# Patient Record
Sex: Female | Born: 1940 | Race: White | Hispanic: No | Marital: Married | Smoking: Never smoker
Health system: Southern US, Community
[De-identification: ages and names within clinical notes are randomized; demographics above are authoritative.]

## PROBLEM LIST (undated history)

## (undated) DIAGNOSIS — Z87898 Personal history of other specified conditions: Secondary | ICD-10-CM

## (undated) DIAGNOSIS — E785 Hyperlipidemia, unspecified: Secondary | ICD-10-CM

## (undated) DIAGNOSIS — R55 Syncope and collapse: Secondary | ICD-10-CM

## (undated) DIAGNOSIS — D649 Anemia, unspecified: Secondary | ICD-10-CM

## (undated) DIAGNOSIS — I10 Essential (primary) hypertension: Secondary | ICD-10-CM

## (undated) DIAGNOSIS — Z90710 Acquired absence of both cervix and uterus: Secondary | ICD-10-CM

## (undated) DIAGNOSIS — I2510 Atherosclerotic heart disease of native coronary artery without angina pectoris: Secondary | ICD-10-CM

## (undated) DIAGNOSIS — E7800 Pure hypercholesterolemia, unspecified: Secondary | ICD-10-CM

## (undated) HISTORY — DX: Hyperlipidemia, unspecified: E78.5

## (undated) HISTORY — DX: Syncope and collapse: R55

## (undated) HISTORY — DX: Anemia, unspecified: D64.9

## (undated) HISTORY — PX: CORONARY ANGIOPLASTY WITH STENT PLACEMENT: SHX49

## (undated) HISTORY — PX: CHOLECYSTECTOMY: SHX55

## (undated) HISTORY — DX: Acquired absence of both cervix and uterus: Z90.710

## (undated) HISTORY — DX: Personal history of other specified conditions: Z87.898

## (undated) HISTORY — PX: APPENDECTOMY: SHX54

---

## 1999-01-26 ENCOUNTER — Other Ambulatory Visit: Admission: RE | Admit: 1999-01-26 | Discharge: 1999-01-26 | Payer: Self-pay | Admitting: Internal Medicine

## 2000-01-19 ENCOUNTER — Encounter: Admission: RE | Admit: 2000-01-19 | Discharge: 2000-01-19 | Payer: Self-pay | Admitting: Internal Medicine

## 2000-01-19 ENCOUNTER — Encounter: Payer: Self-pay | Admitting: Internal Medicine

## 2000-01-21 ENCOUNTER — Other Ambulatory Visit: Admission: RE | Admit: 2000-01-21 | Discharge: 2000-01-21 | Payer: Self-pay | Admitting: Internal Medicine

## 2001-01-08 ENCOUNTER — Other Ambulatory Visit: Admission: RE | Admit: 2001-01-08 | Discharge: 2001-01-08 | Payer: Self-pay | Admitting: Internal Medicine

## 2001-02-26 ENCOUNTER — Encounter: Payer: Self-pay | Admitting: Internal Medicine

## 2001-02-26 ENCOUNTER — Encounter: Admission: RE | Admit: 2001-02-26 | Discharge: 2001-02-26 | Payer: Self-pay | Admitting: Internal Medicine

## 2001-07-17 ENCOUNTER — Encounter: Payer: Self-pay | Admitting: Internal Medicine

## 2001-07-17 ENCOUNTER — Ambulatory Visit (HOSPITAL_COMMUNITY): Admission: RE | Admit: 2001-07-17 | Discharge: 2001-07-17 | Payer: Self-pay | Admitting: Internal Medicine

## 2002-01-31 ENCOUNTER — Other Ambulatory Visit: Admission: RE | Admit: 2002-01-31 | Discharge: 2002-01-31 | Payer: Self-pay | Admitting: Internal Medicine

## 2002-02-27 ENCOUNTER — Encounter: Payer: Self-pay | Admitting: Internal Medicine

## 2002-02-27 ENCOUNTER — Encounter: Admission: RE | Admit: 2002-02-27 | Discharge: 2002-02-27 | Payer: Self-pay | Admitting: Internal Medicine

## 2005-03-10 ENCOUNTER — Encounter: Admission: RE | Admit: 2005-03-10 | Discharge: 2005-03-10 | Payer: Self-pay | Admitting: Internal Medicine

## 2005-03-25 ENCOUNTER — Ambulatory Visit (HOSPITAL_COMMUNITY): Admission: RE | Admit: 2005-03-25 | Discharge: 2005-03-26 | Payer: Self-pay | Admitting: Cardiovascular Disease

## 2005-05-03 ENCOUNTER — Ambulatory Visit (HOSPITAL_COMMUNITY): Admission: RE | Admit: 2005-05-03 | Discharge: 2005-05-04 | Payer: Self-pay | Admitting: Cardiovascular Disease

## 2005-09-26 ENCOUNTER — Encounter (INDEPENDENT_AMBULATORY_CARE_PROVIDER_SITE_OTHER): Payer: Self-pay | Admitting: Specialist

## 2005-09-26 ENCOUNTER — Ambulatory Visit (HOSPITAL_COMMUNITY): Admission: RE | Admit: 2005-09-26 | Discharge: 2005-09-26 | Payer: Self-pay | Admitting: Obstetrics and Gynecology

## 2006-04-14 ENCOUNTER — Encounter: Admission: RE | Admit: 2006-04-14 | Discharge: 2006-04-14 | Payer: Self-pay | Admitting: Internal Medicine

## 2006-12-05 ENCOUNTER — Inpatient Hospital Stay (HOSPITAL_COMMUNITY): Admission: EM | Admit: 2006-12-05 | Discharge: 2006-12-07 | Payer: Self-pay | Admitting: Emergency Medicine

## 2006-12-06 ENCOUNTER — Encounter (INDEPENDENT_AMBULATORY_CARE_PROVIDER_SITE_OTHER): Payer: Self-pay | Admitting: *Deleted

## 2006-12-26 ENCOUNTER — Encounter: Admission: RE | Admit: 2006-12-26 | Discharge: 2006-12-26 | Payer: Self-pay | Admitting: Internal Medicine

## 2007-05-21 ENCOUNTER — Encounter: Admission: RE | Admit: 2007-05-21 | Discharge: 2007-05-21 | Payer: Self-pay | Admitting: Internal Medicine

## 2008-06-26 ENCOUNTER — Encounter: Admission: RE | Admit: 2008-06-26 | Discharge: 2008-06-26 | Payer: Self-pay | Admitting: Internal Medicine

## 2009-06-29 ENCOUNTER — Encounter: Admission: RE | Admit: 2009-06-29 | Discharge: 2009-06-29 | Payer: Self-pay | Admitting: Internal Medicine

## 2010-07-21 ENCOUNTER — Encounter: Admission: RE | Admit: 2010-07-21 | Discharge: 2010-07-21 | Payer: Self-pay | Admitting: Obstetrics and Gynecology

## 2010-07-21 IMAGING — US US ABDOMEN COMPLETE
1 series · 13 of 25 positions shown · non-contrast
Comparison: None.

CLINICAL DATA: Abdominal bloating.

COMPLETE ABDOMINAL ULTRASOUND

[Series 1: us abdomen complete · 0.24mm/px · 13 of 121 slices shown]
[im 1/121]
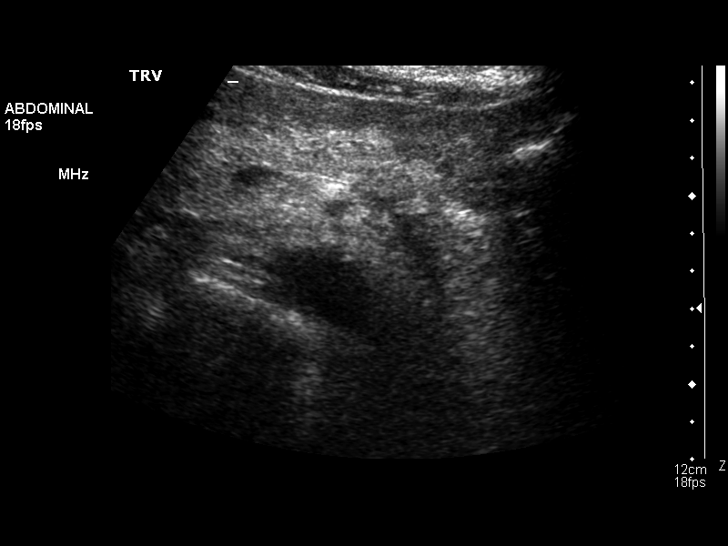
[im 11/121]
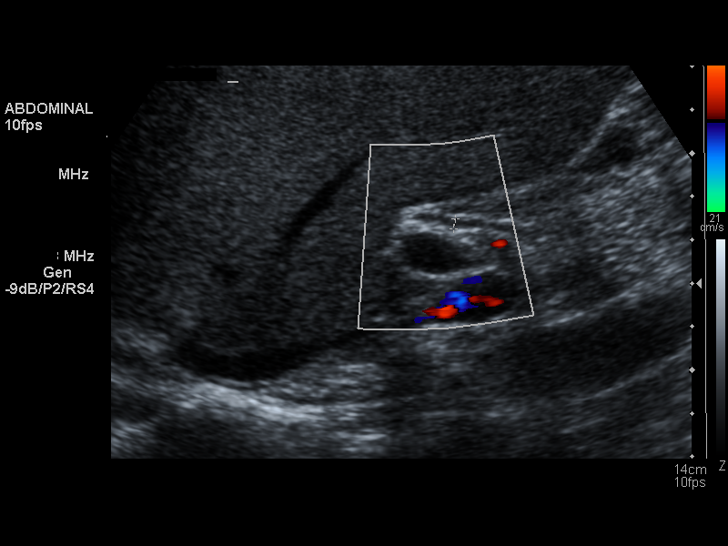
[im 21/121]
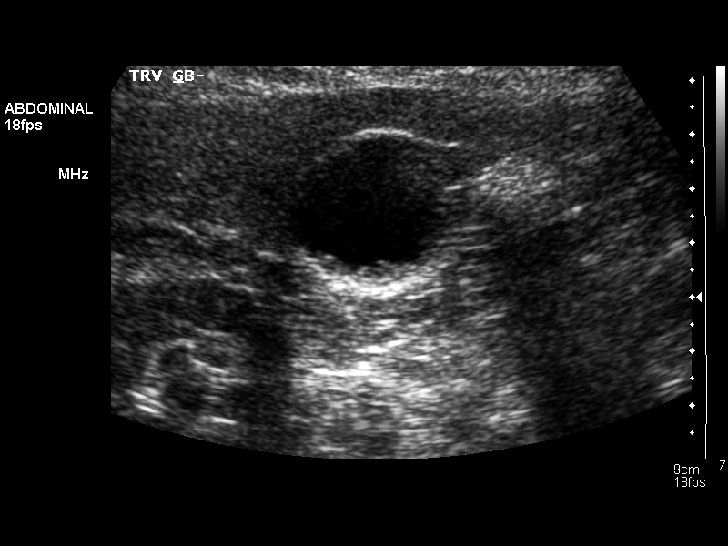
[im 31/121]
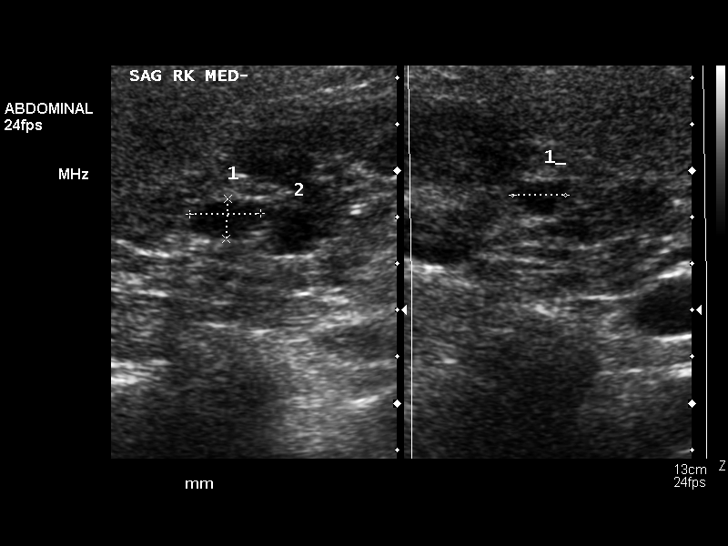
[im 41/121]
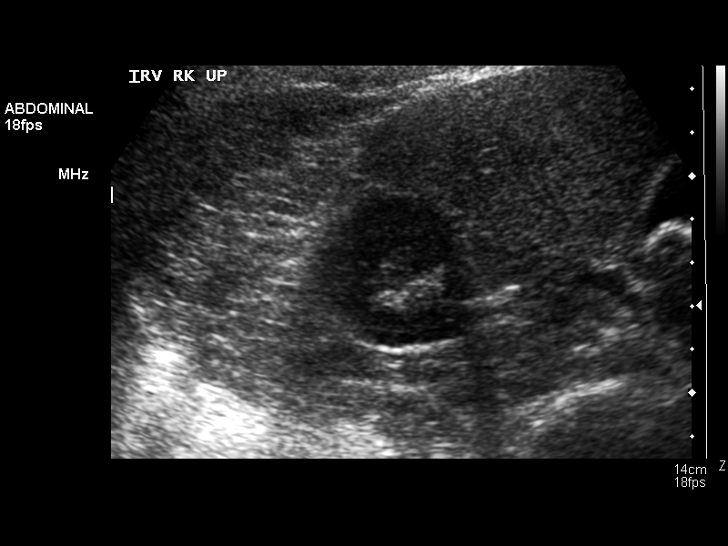
[im 51/121]
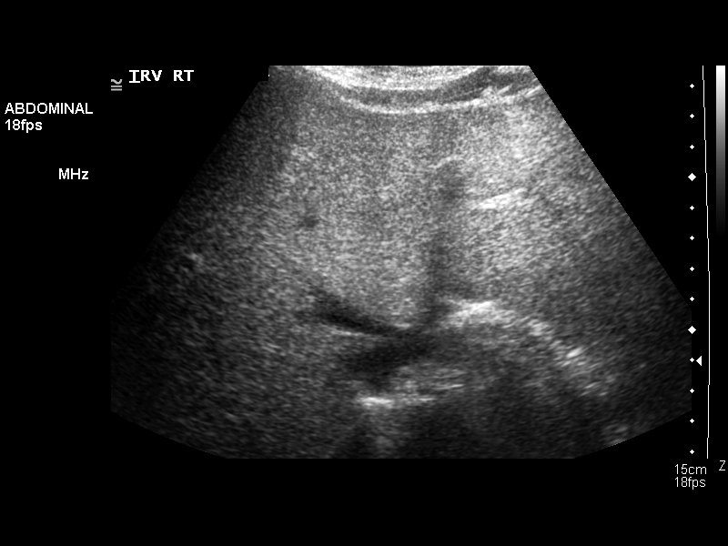
[im 61/121]
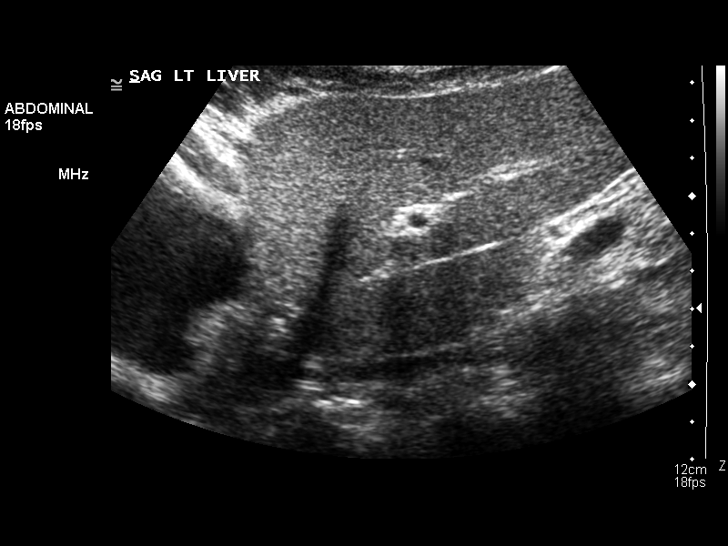
[im 71/121]
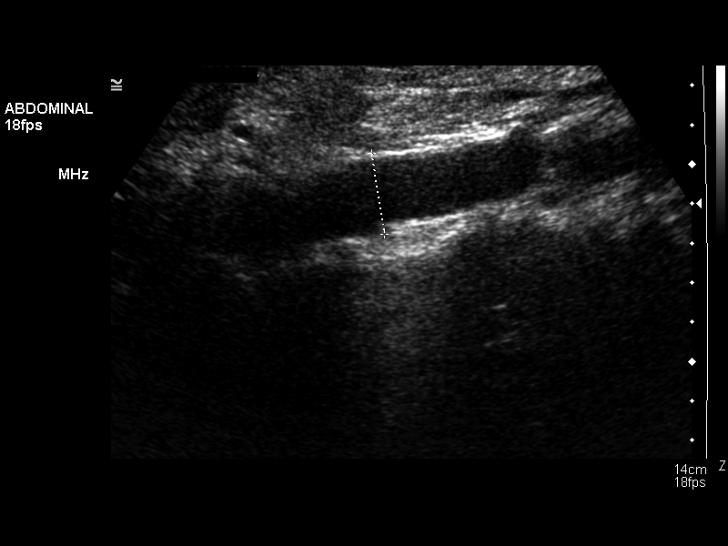
[im 81/121]
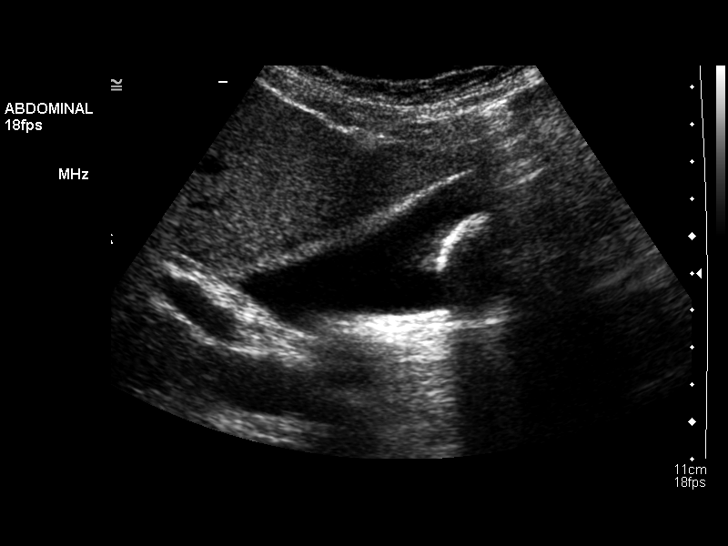
[im 91/121]
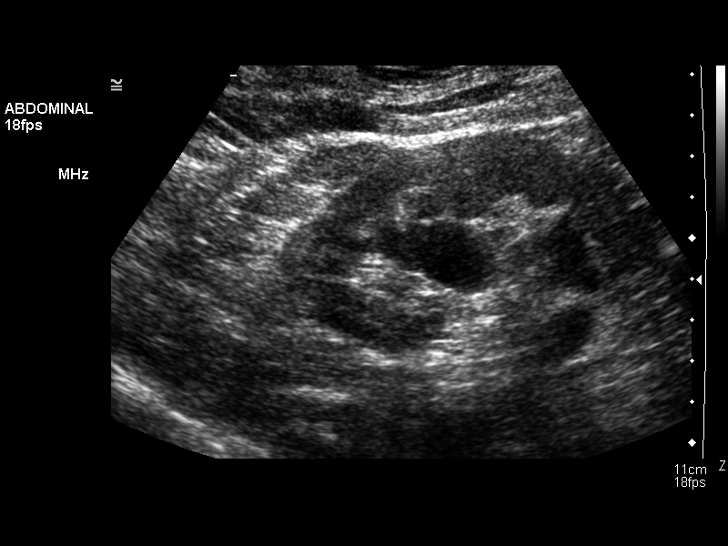
[im 101/121]
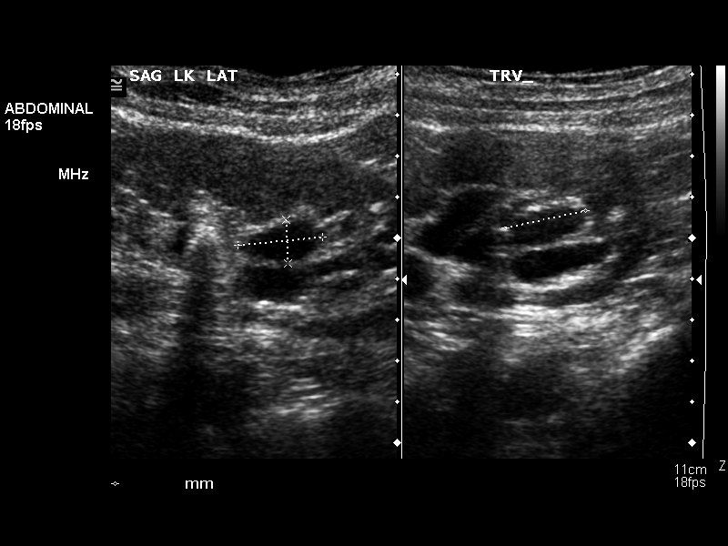
[im 111/121]
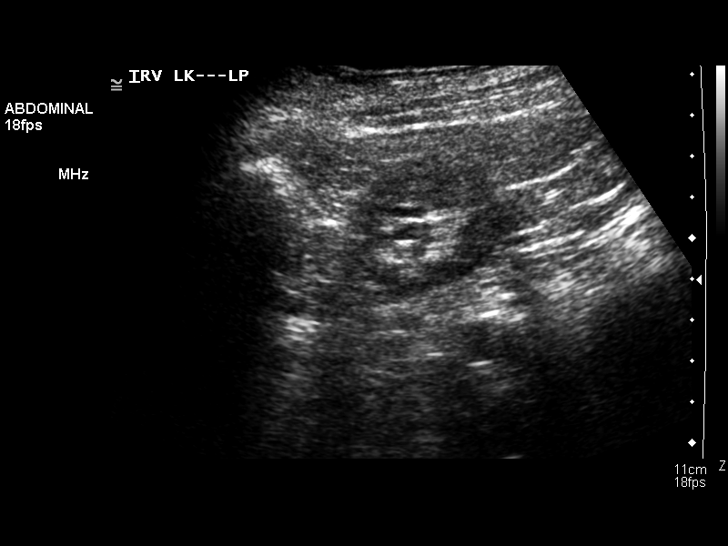
[im 121/121]
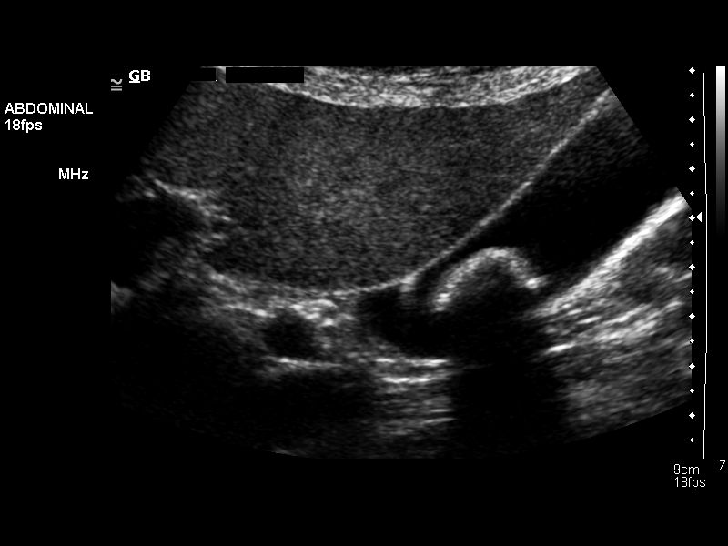

[13 of 25 positions shown; findings below may reference images not displayed]

FINDINGS: Gallbladder:  A mobile echogenic lesion with posterior acoustic
shadowing measures 2.1 cm. Question tiny polyp in the region of the
gallbladder neck.  There is some nonshadowing echogenic debris.
Gallbladder wall measures 2 mm.  No sonographic Murphy's sign.

Common bile duct:  Measures 3 mm, within normal limits.

Liver:  Echotexture is diffusely increased.

IVC:  Infrahepatic portion is obscured by bowel gas.

Pancreas:  Negative.

Spleen:  Measures 8.1 cm.

Right Kidney:  Measures 10.5 cm.  There are anechoic lesions in the
renal sinus region, measuring up to 2.0 x 1.0 x 1.8 cm.
Parenchymal echo texture is uniform.

Left Kidney:  Measures 11.5 cm.  Anechoic lesions with increased
through transmission are seen in the renal sinus, measuring up to
2.4 x 1.5 x 1.7 cm.  A linear shadowing echogenic lesion in the mid
pole measures 2.1 cm.  No hydronephrosis.

Abdominal aorta:  No aneurysm identified.
IMPRESSION: 1.  Combination of gallbladder sludge and stones, without evidence
of acute cholecystitis.
2.  Fatty liver.
3.  Probable bilateral renal sinus cysts.
4.  Nonobstructing left nephrolithiasis.

## 2010-09-03 ENCOUNTER — Encounter: Admission: RE | Admit: 2010-09-03 | Discharge: 2010-09-03 | Payer: Self-pay | Admitting: Internal Medicine

## 2011-03-25 NOTE — Discharge Summary (Signed)
NAMECARINE, [name]                ACCOUNT NO.:  [id number]   MEDICAL RECORD NO.:  [id number]          PATIENT TYPE:  OIB   LOCATION:  6532                         FACILITY:  [facility]   PHYSICIAN:  Nanetta Batty, M.D.   DATE OF BIRTH:  1941/09/25   DATE OF ADMISSION:  05/03/2005  DATE OF DISCHARGE:  05/04/2005                                 DISCHARGE SUMMARY   ADMISSION DIAGNOSES:  1.  Coronary artery disease with diagnostic catheterization status post      percutaneous coronary intervention of the left anterior descending      admitted now for percutaneous coronary intervention of the right      coronary artery.  2.  History of palpitations.  3.  Hypertension.  4.  Hyperlipidemia.   DISCHARGE DIAGNOSES:  1.  Coronary artery disease with diagnostic catheterization status post      percutaneous coronary intervention of the left anterior descending      admitted now for percutaneous coronary intervention of the right      coronary artery.  2.  History of palpitations.  3.  Hypertension.  4.  Hyperlipidemia.   PROCEDURE:  Cardiac catheterization percutaneous coronary intervention of  the right coronary artery stenting with CYPHER stent.   BRIEF HISTORY:  The patient is a 70 year old white female, who presented to  Dr. Allyson Sabal with jaw pain.  She had a negative Cardiolite and eventually  underwent cardiac catheterization which showed significant two vessel  disease including the LAD and the RCA.  She underwent PCI and stenting of  the LAD and is readmitted at this time for PCI and stenting of the RCA.   PAST MEDICAL HISTORY:  As noted above.   CURRENT MEDICATIONS:  1.  Diovan 150 mg daily.  2.  Plavix 75 mg daily.  3.  Clonidine 0.2 mg daily.  4.  Lipitor 20 mg daily.  5.  Aspirin 81 mg daily.   HOSPITAL COURSE:  The patient was admitted, taken to the cath lab and  underwent the above noted procedure, tolerated the  procedure well, returned  to the floor on 6500.  She was  stable all night.  She had one brief episode  of bigeminy.  Otherwise, she was in a sinus rhythm at a rate of 59-60 for  the majority of the night.  Blood pressure was slightly diminished this  morning right after clonidine dose, but is now back up to 107/50.  We plan  to mobilize the patient if she does well.  We will discharge home today.  She will go home on preadmission medications as listed  above with one exception, aspirin will be changed to 325 mg daily. She will  follow up with Dr. Allyson Sabal in two weeks.  She is instructed to do no strenuous  activity for 72 hours, keep the site clean and dry, and call if she has any  problems.       WDJ/MEDQ  D:  05/04/2005  T:  05/04/2005  Job:  [id number]   cc:   Merlene Laughter. Renae Gloss, M.D.  [address]  [address]  [place]  [place] [address]  Fax: [phone]

## 2011-03-25 NOTE — Op Note (Signed)
NAMESONJIA, WILCOXSON                ACCOUNT NO.:  [id number]   MEDICAL RECORD NO.:  [id number]          PATIENT TYPE:  AMB   LOCATION:  SDC                           FACILITY:  [facility]   PHYSICIAN:  Maxie Better, M.D.DATE OF BIRTH:  1941/06/03   DATE OF PROCEDURE:  09/26/2005  DATE OF DISCHARGE:                                 OPERATIVE REPORT   PREOPERATIVE DIAGNOSES:  1.  Postmenopausal bleeding.  2.  Endometrial mass.   PROCEDURE:  1.  Diagnostic hysteroscopy.  2.  Hysteroscopic resection of submucosal fibroid.  3.  Endometrial polypectomy.  4.  Dilation and evacuation.   POSTOPERATIVE DIAGNOSES:  1.  Postmenopausal bleeding.  2.  Submucosal fibroid.  3.  Endometrial polyp.   ANESTHESIA:  General.   SURGEON:  Dr. Maxie Better.   INDICATION:  This is a 70 year old gravida 3, para 2, postmenopausal female  on hormone replacement therapy with postmenopausal bleeding, who was found  to have an endometrial mass on sonohysterogram and who now presents for  surgical management.  The risks and benefits of the procedure have been  explained to the patient.  Consent was signed.  The patient was transferred  to the operating room.  Due to the fact that the patient was still on her  Plavix and just stopped her aspirin 4 days ago, she was instructed that if  there was a significant submucosal fibroid, that it would not be resected  due to concern for bleeding.  Otherwise, the plan is to hopefully just the  endometrial polyp, remove the polyp at that time, and gently do a D&C.  She  concurred with the plan.  The patient was transferred to the operating room.   DESCRIPTION OF PROCEDURE:  Under adequate general anesthesia, the patient  was placed in the dorsal lithotomy position.  She was sterilely prepped and  draped in the usual fashion.  The bladder was catheterized of a scant amount  of urine.  Examination under anesthesia revealed a relaxed introitus, small  rectocele, a  small anteverted uterus; no adnexal masses could be  appreciated.  A bivalve speculum was placed in the vagina.  A single-tooth  tenaculum was placed on the anterior lip of the cervix.  The cervix had a  pinpoint os.  Cervix was then gently dilated up to #27 [facility] dilator.  A  diagnostic hysteroscope was introduced into the uterine cavity.  Polypoid  lesion was noted in the anterior wall.  A small submucosal fibroid was seen  in the right fundal region, and a tiny polyp was noted near the right tubal  ostia.  The left tubal ostia was sclerosed; the right was patent.  No  lesions in the endocervical canal were seen.  The hysteroscope was removed.  A polyp forceps was then utilized to grasp the polyp anteriorly in 2 pieces,  and the hysteroscope was then reinserted.  The cavity was then inspected.  Decision was then made to attempt to resect the small fibroid.  The cervix  was then serially dilated up to a #31 dilator.  A hysteroscope with a double  loop was introduced into the uterine cavity.  Using coagulation, the small  fibroid was then resected.  The small polyp was removed.  The cavity was  inspected and no other lesions noted.  The hysteroscope was then removed.  The cavity was then curetted and tissue obtained.  The procedure was then  terminated by removing all instruments from the vagina.  Specimen labeled  endometrial polyp, submucosal fibroid, and endometrial curettings were sent  to pathology.  Estimated blood loss was minimal.  Fluid deficit was 200 mL.  Complication was none.  Sponge and instrument x2 is correct.  The patient  tolerated the procedure well, was transferred to the recovery room in stable  condition.      Maxie Better, M.D.  Electronically Signed     [place]/MEDQ  D:  09/26/2005  T:  09/26/2005  Job:  [id number]   cc:   Merlene Laughter. Renae Gloss, M.D.  Fax: [phone]

## 2011-03-25 NOTE — Discharge Summary (Signed)
[name], [name]                ACCOUNT NO.:  [id number]   MEDICAL RECORD NO.:  [id number]          PATIENT TYPE:  OIB   LOCATION:  6533                         FACILITY:  [facility]   PHYSICIAN:  Nanetta Batty, M.D.   DATE OF BIRTH:  09-Mar-1941   DATE OF ADMISSION:  03/25/2005  DATE OF DISCHARGE:                                 DISCHARGE SUMMARY   DISCHARGE DIAGNOSES:  1.  Coronary artery disease, status post elective intervention to the left      anterior descending this admission.  The patient still needs staged      procedures to the right coronary artery.  2.  Hypertension.  3.  Hyperlipidemia, treated.  4.  Palpitations.   HISTORY OF PRESENT ILLNESS:  This is 70 year old Caucasian female patient of  Dr. Andi Devon who initially presented to our office for evaluation of  chest pain and jaw pain and palpitations.  She was scheduled for Cardiolite  stress test which revealed no obvious ischemia, but some mild attenuation  and also elevated blood pressure was noted during exercise portion of the  test.  When she was seen in followup in the office by Dr. Allyson Sabal, she  continued to complain of some shortness of breath, exertional, as well as  jaw pain and the decision was made to schedule the patient for outpatient  cardiac catheterization at [facility] to make sure that she  does not have so-called balanced ischemia which was not picked up on the  Cardiolite stress test.  She underwent the outpatient cardiac  catheterization on Mar 21, 2005 and it showed three-vessel coronary disease  and LAD had 80% segmental stenosis.  The circumflex had 40-50% stenosis and  the RCA 75% stenosis.   The patient was seen in the office in followup and then scheduled for  elective coronary intervention by Dr. Allyson Sabal in [facility] on Mar 25, 2005.   HOSPITAL PROCEDURES:  Percutaneous coronary angiography and stenting using  CYPHER drug-eluting stent of left anterior  descending with reduction of the  lesion from 70% to 0%.  This was done on Mar 25, 2005 by Dr. Allyson Sabal.   HOSPITAL COMPLICATIONS:  None.   HOSPITAL COURSE:  The patient tolerated the procedure well and was  transferred to the unit in stable condition.  The next morning assessed by  Dr. Clarene Duke, her groin site did not reveal any bruit, but mild bruising.  She  was stable for discharge home.  Vital signs were normal.  Heart rate 70,  blood pressure 110/40.   The EKG showed normal sinus rhythm.   Hemoglobin 10.2, potassium 4.0, BUN 8, creatinine 0.8.   DISCHARGE MEDICATIONS:  1.  Aspirin 81 mg daily.  2.  Plavix 75 mg daily.  3.  Quinidine 20 mg daily.  4.  Diovan 160 mg daily.  5.  Lipitor 20 mg daily.  6.  Fem-HRT 1 mg/5 mcg one pill daily.   DISCHARGE ACTIVITIES:  No driving, no lifting greater than 5 pounds.  No  strenuous activity for 3 days post catheterization.   DISCHARGE DIET:  Low salt, low cholesterol diet.   DISCHARGE INSTRUCTIONS:  She was allowed to take a shower.  Instructed to  wash the groin site with mild soap gently and pat dry.  Avoid rubbing.   DISCHARGE FOLLOWUP:  Our office will call the patient to set up an  appointment with Dr. Allyson Sabal to be seen within 2 weeks.      MK/MEDQ  D:  03/26/2005  T:  03/27/2005  Job:  [id number]   cc:   [facility]

## 2011-03-25 NOTE — Discharge Summary (Signed)
[name], [name]                ACCOUNT NO.:  [id number]   MEDICAL RECORD NO.:  [id number]          PATIENT TYPE:  INP   LOCATION:  1401                         [facility]ACILITY:  [facility]   PHYSICIAN:  Hillery Aldo, M.D.   DATE OF BIRTH:  Aug 10, 1941   DATE OF ADMISSION:  12/05/2006  DATE OF DISCHARGE:  12/07/2006                               DISCHARGE SUMMARY   PRIMARY CARE PHYSICIAN:  Dr. Andi Devon.   CARDIOLOGIST:  Dr. Nanetta Batty.   DISCHARGE DIAGNOSES:  1. Syncope thought to be related to dehydration in the setting of      ongoing antihypertensive use  2. Normocytic anemia, outpatient workup recommended.  3. Hyperlipidemia.  4. Hypertension.  5. Coronary artery disease.  6. History of vertigo.   DISCHARGE MEDICATIONS:  1. Aspirin 325 mg daily.  2. Benicar/hydrochlorothiazide 40/25 daily.  3. Clonidine 0.2 mg every night.  4. Lipitor 20 mg daily.  5. Multivitamin daily.  6. Plavix 75 mg daily.   CONSULTATIONS:  None.   BRIEF HISTORY OF PRESENT ILLNESS:  The patient is a 70 year old female  whose daughter was recently admitted to the hospital.  The patient  evidently stayed with the daughter late, had scanty p.o. intake, took  her antihypertensive medications and then when awoken from sleep at  about 4:00 a.m., felt dizzy.  She then had several episodes of nausea  accompanied by vomiting and had a syncopal episode.  She was admitted  for further evaluation and workup.  For the full details, please see the  dictated report done by Dr. Corky Downs.   PROCEDURES AND DIAGNOSTIC STUDIES:  1. CT scan of the head on December 05, 2006 was negative.  2. Chest x-ray on December 05, 2006 showed no acute disease.  3. MRI/MRA of brain on December 05, 2006 showed normal MRI of the      brain.  Normal intracranial MR angiography of the large and medium      sized vessels.  4. A 2-D echocardiogram on December 06, 2006 showed normal left      ventricular systolic function with no left  ventricular regional      wall motion abnormalities.  There was mild mitral valvular      regurgitation.   DISCHARGE LABORATORY VALUES:  Sodium was 147, potassium 4.1, chloride  117, bicarb 25, BUN 11, creatinine 0.90, glucose 102.  White blood cell  count 6.9, hemoglobin 10.3, hematocrit 29.9, platelets 245 with an MCV  of 91.1.  Repeat urinalysis was negative for nitrites and leukocyte  esterase.   HOSPITAL COURSE:  1. Syncope:  The patient's syncope was thought to be related to      ongoing antihypertensive use in the setting of dehydration.  Her      BUN and creatinine were elevated suggesting dehydration.  This      resolved with IV fluid rehydration.  The patient had no further      dizzy spells while in the hospital.  Her antihypertensive      medications were held secondary to low blood pressure.  Her  discharge blood pressure is 135/81.  She is instructed to resume      her antihypertensives.  2. Questionable urinary tract infection:  The patient did have a      urinalysis with culture done on admission.  Culture inevitably grew      out 40,000 colonies of multiple bacterial morphotypes.  Urinalysis      was repeated which was negative for nitrites and leukocyte      esterase.  3. Normocytic anemia:  The patient does have a persistent normocytic      anemia.  Her hemoglobin has dropped with IV fluid rehydration.  We      recommend further GI evaluation as an outpatient for further      workup.  4. Hyperlipidemia:  The patient's fasting lipid panel was checked      while she was in the hospital.  Testing revealed a total      cholesterol of 128, triglycerides 144, HDL 40, and LDL 59,      indicating excellent control on her current dose of Lipitor.  5. Hypertension:  Again, the patient's antihypertensives were held      during the course of her hospitalization.  Her blood pressure has      now crept up, and it is safe for her to resume these medications.  6. Coronary  artery disease:  The patient was continued on antiplatelet      therapy and had no complaints of chest pain during the course of      her hospitalization.  Additionally, cardiac markers were cycled      q.8h. x3.  Her troponin I was not elevated.  She did have a mild      elevation of her CK and CK-MB, but the index was not significant.      CK normalized on the third set as did the MB.   DISPOSITION:  At this point, the patient is stable for discharge home.  She is instructed to follow up with Dr. Renae Gloss early next week.      Hillery Aldo, M.D.  Electronically Signed     CR/MEDQ  D:  12/07/2006  T:  12/07/2006  Job:  [id number]   cc:   Merlene Laughter. Renae Gloss, M.D.  Fax: [phone]   Nanetta Batty, M.D.  Fax: [phone]

## 2011-03-25 NOTE — Cardiovascular Report (Signed)
[name], [name]                ACCOUNT NO.:  [id number]   MEDICAL RECORD NO.:  [id number]          PATIENT TYPE:  OIB   LOCATION:  2899                         FACILITY:  [facility]   PHYSICIAN:  Nanetta Batty, M.D.   DATE OF BIRTH:  [birth year]   DATE OF PROCEDURE:  03/25/2005  DATE OF DISCHARGE:                              CARDIAC CATHETERIZATION   PROCEDURE PERFORMED:  Intervention.   CARDIOLOGIST:  Nanetta Batty, M.D.   INDICATIONS FOR PROCEDURE:  [name] is a 70 year old female referred by  Dr. Renae Gloss because of episodes of jaw pain.  The patient had a negative  Cardiolite in the past.  She underwent a diagnostic cath earlier this week  at [facility] revealing ischemic disease in the LAD and the RCA  with moderate disease in the circumflex, and normal LV function.  She  presents now for angiography and intervention with IVUS guidance.   DESCRIPTION OF PROCEDURE:  The patient was brought to the second floor [facility] cardiac cath lab in the post absorptive state.  She was premedicated  with p.o. Valium.  The right groin was prepped and shaved in the usual  sterile fashion.  One percent Xylocaine was used for local anesthesia.  A 7  French sheath was inserted into the right femoral artery using the standard  Seldinger technique.  A 6 French sheath was inserted into the right femoral  vein.  A 7 French left 3.5 __________  with side holes along with an OM-4  190 Asahi soft guidewire and a 2.5 x 10 cutting balloon were used for  atherectomy of the LAD after IVUS interrogation.   Visipaque dye was used for the entirety of the case.  Retrograde aortic  pressure was monitored at the end of the case.  The patient had been on  aspirin and Plavix, and received an additional 300 mg of Plavix p.o. She  received 3500 units of heparin intravenously and Integrilin double bolus  infusion.   IVUS interrogation of the LAD revealed the distal reference to be about 3 mm  and the vessel to be, at its thinnest point, 1.4 x 1.8.  This was then cut  with the cutting balloon, and nominal pressures, and stented with a 3.0 x 18  Cypher at 12 atmospheres.  Two-hundred micrograms of intracoronary  nitroglycerin was administered.   The final angiographic result was reduction of a 70% angiographic and  tighter by IVUS mid segmental LAD lesion to zero percent residual an TIMI  III flow.  The patient did complain of chest pain during balloon inflation,  which resolved with balloon deflation.  The diagonal branch which arose from  in the middle of this plaque remained widely patent during the procedure and  was small-to-medium in size with a 75% ostial stenosis.   The guidewire was then withdrawn and advanced down the circumflex.  IVUS was  performed to the circumflex revealing approximately a 3 mm x 3 mm distal  vessel with 2.3 x 2.5 at its tightest diameter.  The plaque extended to the  ostium of the circumflex.  IMPRESSION:  1.  Left anterior descending percutaneous coronary intervention and stenting      using a Cypher drug-eluting stent.  2.  Intravascular ultrasound of the proximal circumflex revealed extensive      plaque; however, this did not warrant intervention, especially given its      proximity to the left main.   The guidewire and the catheter were removed.  The __________.  The sheaths  were sewed securely in place and observed overnight.  Heparin was  discontinued.  Integrilin will be continued for 18 hours.  The sheaths will  be removed once she __________ .  She will be discharged home in the  morning.  I will see her back in the office in one to two weeks.  We will  stage intervention on the RCA.   The patient left the lab in stable condition.      JB/MEDQ  D:  03/25/2005  T:  03/26/2005  Job:  [id number]   cc:   Second Floor [facility]   [facility]  [address]   [place], [place]  [address]   Merlene Laughter. Renae Gloss, M.D.  [address]  [address]  [place]  [place] [address]  Fax: [phone]

## 2011-03-25 NOTE — Cardiovascular Report (Signed)
[name], [name]                ACCOUNT NO.:  [id number]   MEDICAL RECORD NO.:  [id number]          PATIENT TYPE:  OIB   LOCATION:  2899                         FACILITY:  [facility]   PHYSICIAN:  Nanetta Batty, M.D.   DATE OF BIRTH:  05-Aug-1941   DATE OF PROCEDURE:  05/03/2005  DATE OF DISCHARGE:                              CARDIAC CATHETERIZATION   PROCEDURE:  Percutaneous coronary intervention and stenting.   CARDIOLOGIST:  Nanetta Batty, M.D.   INDICATIONS FOR PROCEDURE:  [name] is a 70 year old female, a patient of  Dr. Cala Bradford R. Shelton's who was complaining of jaw pain but had not gotten  __________.  The initial cardiac catheterization performed at [facility] revealed LAD and RCA disease with mild circumflex disease and  normal LV function.  She underwent an IVUS-guided PCI and stenting of her  proximal LAD with a 30.18 CYPHER drug-eluting stent.  IVUS of her proximal  circumflex did not reveal a critical lesion.  She presents now for staged  intervention of her mid-RCA.   DESCRIPTION OF PROCEDURE:  The patient was brought to the second floor [facility]iac Catheterization Laboratory in the post-  absorptive state.  She is premedicated with p.o. Valium.  Her right groin is  prepped and shaved in the usual sterile fashion.  Xylocaine 1% was used for  local anesthesia.  A 6-French sheath was inserted into the right femoral  artery using a standard Seldinger technique.  The 6-French JR4 guide  catheter along with an OM4 190 Ashahi soft wire and a 3.0 x 13.0 CYPHER drug-  eluting stent were used for direct stenting.  The patient was on aspirin and  Plavix at home and received an additional 150 mg of Plavix this morning.  She received Angiomax bolus of an ACT greater than 300.  Visipaque dye was  used for the entirety of the case.  Retrograde aortic pressure monitored  through the case.   Using the Ashahi wire, the lesion was crossed  and direct stenting was  performed with the CYPHER stent at 15 atmospheres.  Following this, post-  dilatation was a 3.25 x 12.0 Quantum Maverick was attempted over the Ashahi  soft; however, because of wire bias, the balloon was unable to cross the  proximal stent, probably because of strut hang-up.  A buddy wire technique  was used with a BMW wire, over which the Quant rum Maverick passed smoothly  into the stented segment.  The Ashahi wire was then removed and the balloon  placement was verified precisely angiographically, fluoroscopically.  It was  inflated to 15 atmospheres for 40 seconds, resulting in a reduction of a 70%  mid-RCA stenosis to 0% residual without dissection.   The patient tolerated the procedure well.  There were no hemodynamic  complications or sequela.  The guide wire and catheter were removed.  There  was some oozing around the sheath and thus this was up-graded over the J-  wire  to a 7-French sheath, which resulted in hemostasis.  The sheath will be  removed in two hours.  The patient will be hydrated and discharged in the  morning on aspirin and Plavix.  We will see her back in the office in two  weeks for followup.  She left the laboratory in stable condition.       JB/MEDQ  D:  05/03/2005  T:  05/03/2005  Job:  [id number]   cc:   Cardiac Cath Lab  -  [facility]   [place] R. Renae Gloss, M.D.  [address]  [address]  [place]  [place] [address]  Fax: [phone]

## 2011-03-25 NOTE — H&P (Signed)
[name], [name]                ACCOUNT NO.:  [id number]   MEDICAL RECORD NO.:  [id number]          PATIENT TYPE:  EMS   LOCATION:  ED                           FACILITY:  [facility]   PHYSICIAN:  Mobolaji B. Bakare, M.D.DATE OF BIRTH:  [birth year]   DATE OF ADMISSION:  12/05/2006  DATE OF DISCHARGE:                              HISTORY & PHYSICAL   Primary care physician:  Merlene Laughter. Renae Gloss, M.D.  Cardiologist:  Nanetta Batty, M.D.   CHIEF COMPLAINT:  Dizziness and passed out early this morning.   HISTORY OF PRESENTING COMPLAINT:  [name] is a 70 year old Caucasian  female whom I saw her daughter yesterday in the emergency room.  Her  daughter was admitted and the patient stayed with her daughter until  about 11 p.m., when she went back home to use her medications.  Apparently she skipped lunch and for the most part only had [employer]  burger yesterday evening and her fluid intakes have been low.  She  returned back to the emergency room to stay with her daughter.  About 4  a.m. she was woken up from sleep.  She felt dizzy.  There was no  vertigo, no chest pain or headaches or shortness of breath.  She  developed nausea and vomiting and she vomited twice.  The next day she  noted that she passed out and was on the floor.  She bumped her  forehead.  Head CT scan was negative for any intracranial abnormality.  She had an EKG which showed marked sinus bradycardia with a heart rate  of 56.  Initial vitals showed a temperature of 96.6 and blood pressure  of 95/54.  She was not orthostatic.  The patient's blood pressure  usually runs around 120 systolic and 60-70 diastolic.   She has since received IV fluids.  She is feeling better, no more  vomiting, no more nausea.  She denies chest pain, shortness of breath,  palpitations.  There is no vertigo.   REVIEW OF SYSTEMS:  There is no dysuria, urgency or increased frequency  of micturition.  No abdominal pain or diarrhea.  The  patient denies  focal weakness.   PAST MEDICAL HISTORY:  1. Hyperlipidemia.  2. Hypertension.  3. Coronary artery disease, status post two stents in 2006.  4. History of vertigo.   PAST SURGICAL HISTORY:  1. Appendectomy.  2. Cholecystectomy.  3. Hysterectomy.   CURRENT MEDICATIONS:  1. Aspirin 325 mg daily.  2. Benicar/hydrochlorothiazide 40/25 mg daily.  3. Clonidine 0.2 mg q.h.s.  4. Lipitor 20 mg daily.  5. Multivitamin one daily.  6. Plavix 75 mg daily.   ALLERGIES:  CODEINE.   FAMILY HISTORY:  Both parents are deceased.  Father passed away from  heart troubles.  Mother died of liver cancer 10 years ago.  Her  grandmother had CVA.   SOCIAL HISTORY:  She is a Immunologist.  She does not smoke cigarettes  nor drink alcohol.  She is independent of activities of daily living.  She has 3 daughters.  One has cerebral palsy.   PHYSICAL EXAMINATION:  VITAL  SIGNS:  Temperature 96.6, now 97, blood  pressure 95/54, currently 105/53, a pulse rate which is 60-65,  respiratory rate is 16, O2 saturations are 95% on room air.  GENERAL:  On examination, the patient is comfortable, not in any  respiratory distress.  HEENT:  Normocephalic.  Slight bruise on the forehead and bridge of  nose.  There is no significant hematoma.  Pupils equal, round, and  reactive to light.  Mild nystagmus.  There is no elevated JVD.  Mucous  membranes dry.  There is no carotid bruit.  LUNGS:  Clear clinically to auscultation.  CARDIOVASCULAR:  S1-S2 regular.  No murmur or gallop.  ABDOMEN:  Nondistended, soft, nontender, bowel sounds present.  EXTREMITIES:  No pedal edema or calf tenderness.  Dorsalis pedis pulse  is 2+ bilaterally.  CENTRAL NERVOUS SYSTEM:  No focal neurologic weakness.  Cranial nerves  intact.   INITIAL LABORATORY DATA:  Sodium 142, potassium 3.5, chloride 109, CO2  24, glucose 122, BUN 23, creatinine 1.33, calcium 9.5.  White cells  12.3, hemoglobin 11.8, hematocrit 34.2, MCV  90.6, platelets 260,  neutrophils 91%.  Cardiac markers at the point of care:  Myoglobin 330,  CK, MB and troponin were normal.  Second set of cardiac markers showed  myoglobin of 305 with normal CK, MB and troponin.  Urinalysis:  Turbid  in appearance, specific gravity of 1.021, nitrite negative, small  leukocytes.  Microscopy showed many epithelial cells with white cell of  7-10.  A CT scan was negative for any intracranial abnormality.  Chest x-  ray shows no acute abnormality.   ASSESSMENT AND PLAN:  [name] is a 70 year old Caucasian female who  presents with syncope which was preceded by dizziness and vomiting with  nausea.  She was not orthostatic at the time of initial evaluation.  She  had relative hypotension and mild hypothermia.  EKG showed sinus  bradycardia with a heart rate of 56.  No acute ischemic changes.  Cardiac markers at the point of care show slightly elevated myoglobin.  She does have a history of coronary artery disease and hyperlipidemia.   ADMISSION DIAGNOSES:  1. Syncope, likely vasovagal.  Will admit to telemetry and cycle      cardiac enzymes q.8h.  Obtain 2 D echocardiogram.  Will repeat  orthostatic blood pressure and continue IV fluids at 75 mL/hr.  Zofran 4  mg q.4h. for nausea.  1. Dizziness associated with nausea and vomiting.  The patient does      have a history of vertigo.  She had mild nystagmus on examination.      These symptoms may be related to chronic vertigo.  Will check      MRI/MRA of the head and neck to rule out posterior circulation      stroke.  The patient does not have neurologic weakness at this      point.  2. Dehydration with elevated creatinine and relative hypotension.      This might have been compounded with poor p.o. intake for the most      part yesterday while she was with her daughter in the hospital.  In      addition, she used a q.h.s. clonidine prior to onset of symptoms.     Will hold antihypertensives for now and  continue hydration with IV      fluids.  3. Mild hypothermia.  This has resolved.  4. Positive urinalysis.  This suggests contamination given the too  many epithelial cells noted.  I will repeat urinalysis and obtain a      clean catch urine.  5. History of coronary artery disease.  She does not have anginal      symptoms.  We will monitor her on telemetry and cycle cardiac      enzymes as mentioned above.  6. Hyperlipidemia.  Resume Lipitor.  Will check fasting lipid profile.      Mobolaji B. Corky Downs, M.D.  Electronically Signed     MBB/MEDQ  D:  12/05/2006  T:  12/05/2006  Job:  [id number]   cc:   Nanetta Batty, M.D.  Fax: [phone]   Merlene Laughter. Renae Gloss, M.D.  Fax: [phone]

## 2011-09-01 ENCOUNTER — Other Ambulatory Visit: Payer: Self-pay | Admitting: Internal Medicine

## 2011-09-01 DIAGNOSIS — Z1231 Encounter for screening mammogram for malignant neoplasm of breast: Secondary | ICD-10-CM

## 2011-09-08 ENCOUNTER — Ambulatory Visit
Admission: RE | Admit: 2011-09-08 | Discharge: 2011-09-08 | Disposition: A | Discharge status: Home / Self Care | Payer: Medicare Other | Source: Ambulatory Visit | Attending: Internal Medicine | Admitting: Internal Medicine

## 2011-09-08 DIAGNOSIS — Z1231 Encounter for screening mammogram for malignant neoplasm of breast: Secondary | ICD-10-CM

## 2012-07-19 ENCOUNTER — Encounter (HOSPITAL_COMMUNITY): Payer: Self-pay | Admitting: *Deleted

## 2012-07-19 ENCOUNTER — Emergency Department (HOSPITAL_COMMUNITY): Payer: Medicare Other

## 2012-07-19 ENCOUNTER — Emergency Department (HOSPITAL_COMMUNITY)
Admission: EM | Admit: 2012-07-19 | Discharge: 2012-07-19 | Disposition: A | Discharge status: Home / Self Care | Payer: Medicare Other | Attending: Emergency Medicine | Admitting: Emergency Medicine

## 2012-07-19 DIAGNOSIS — I10 Essential (primary) hypertension: Secondary | ICD-10-CM | POA: Insufficient documentation

## 2012-07-19 DIAGNOSIS — I2510 Atherosclerotic heart disease of native coronary artery without angina pectoris: Secondary | ICD-10-CM | POA: Insufficient documentation

## 2012-07-19 DIAGNOSIS — N23 Unspecified renal colic: Secondary | ICD-10-CM

## 2012-07-19 DIAGNOSIS — R109 Unspecified abdominal pain: Secondary | ICD-10-CM | POA: Insufficient documentation

## 2012-07-19 DIAGNOSIS — N201 Calculus of ureter: Secondary | ICD-10-CM | POA: Insufficient documentation

## 2012-07-19 HISTORY — DX: Pure hypercholesterolemia, unspecified: E78.00

## 2012-07-19 HISTORY — DX: Essential (primary) hypertension: I10

## 2012-07-19 HISTORY — DX: Atherosclerotic heart disease of native coronary artery without angina pectoris: I25.10

## 2012-07-19 LAB — CBC WITH DIFFERENTIAL/PLATELET
Basophils Absolute: 0 10*3/uL (ref 0.0–0.1)
Eosinophils Absolute: 0.1 10*3/uL (ref 0.0–0.7)
Lymphocytes Relative: 14 % (ref 12–46)
Lymphs Abs: 1.6 10*3/uL (ref 0.7–4.0)
Monocytes Relative: 7 % (ref 3–12)
Neutro Abs: 9.1 10*3/uL — ABNORMAL HIGH (ref 1.7–7.7)
Platelets: 398 10*3/uL (ref 150–400)
WBC: 11.7 10*3/uL — ABNORMAL HIGH (ref 4.0–10.5)

## 2012-07-19 LAB — URINE MICROSCOPIC-ADD ON

## 2012-07-19 LAB — BASIC METABOLIC PANEL
CO2: 22 mEq/L (ref 19–32)
Calcium: 9.8 mg/dL (ref 8.4–10.5)
Chloride: 105 mEq/L (ref 96–112)
GFR calc Af Amer: 46 mL/min — ABNORMAL LOW (ref >90)
GFR calc non Af Amer: 40 mL/min — ABNORMAL LOW (ref >90)
Glucose, Bld: 144 mg/dL — ABNORMAL HIGH (ref 70–99)
Potassium: 4.9 mEq/L (ref 3.5–5.1)

## 2012-07-19 LAB — LIPASE, BLOOD: Lipase: 70 U/L — ABNORMAL HIGH (ref 11–59)

## 2012-07-19 LAB — URINALYSIS, ROUTINE W REFLEX MICROSCOPIC
Glucose, UA: NEGATIVE mg/dL
Hgb urine dipstick: NEGATIVE
Protein, ur: NEGATIVE mg/dL

## 2012-07-19 MED ORDER — TAMSULOSIN HCL 0.4 MG PO CAPS: ORAL_CAPSULE | ORAL | Status: DC

## 2012-07-19 MED ORDER — CIPROFLOXACIN HCL 500 MG PO TABS: 500.0000 mg | ORAL_TABLET | Freq: Two times a day (BID) | ORAL | Status: AC

## 2012-07-19 MED ORDER — CIPROFLOXACIN HCL 500 MG PO TABS
500.0000 mg | ORAL_TABLET | Freq: Once | ORAL | Status: AC
  Administered 2012-07-19: 500 mg via ORAL
  Filled 2012-07-19: qty 1

## 2012-07-19 MED ORDER — TAMSULOSIN HCL 0.4 MG PO CAPS
0.4000 mg | ORAL_CAPSULE | Freq: Once | ORAL | Status: AC
  Administered 2012-07-19: 0.4 mg via ORAL
  Filled 2012-07-19: qty 1

## 2012-07-19 MED ORDER — HYDROMORPHONE HCL 2 MG PO TABS: 2.0000 mg | ORAL_TABLET | ORAL | Status: AC | PRN

## 2012-07-19 NOTE — ED Provider Notes (Signed)
History     CSN: [id number]  Arrival date & time 07/19/12  0151   First MD Initiated Contact with Patient 07/19/12 [phone]      Chief Complaint  Patient presents with  . Abdominal Pain    (Consider location/radiation/quality/duration/timing/severity/associated sxs/prior treatment) HPI This is a 71 year old white female recently treated with Cipro for urinary tract infection. She is here with right lower quadrant abdominal pain that began about 10:30 yesterday evening while watching TV. It was severe at first but has considerably decreased subsequently. It is now primarily located in the right flank. She has no history of kidney stones. The pain is not worsened with movement or position. She's had nausea and retching but no frank vomiting. She is experiencing urinary urgency and passing small amounts. She's not having a fever.  Past Medical History  Diagnosis Date  . Coronary artery disease   . Hypertension   . Hypercholesteremia     Past Surgical History  Procedure Date  . Coronary angioplasty with stent placement     History reviewed. No pertinent family history.  History  Substance Use Topics  . Smoking status: Never Smoker   . Smokeless tobacco: Not on file  . Alcohol Use: No    OB History    Grav Para Term Preterm Abortions TAB SAB Ect Mult Living                  Review of Systems  All other systems reviewed and are negative.    Allergies  Codeine  Home Medications   Current Outpatient Rx  Name Route Sig Dispense Refill  . ASPIRIN 81 MG PO CHEW Oral Chew 81 mg by mouth daily.    Marland Kitchen VITAMIN D 1000 UNITS PO TABS Oral Take 1,000 Units by mouth daily.    Marland Kitchen CLONIDINE HCL 0.2 MG PO TABS Oral Take 0.2 mg by mouth daily.    . FENOFIBRATE 160 MG PO TABS Oral Take 160 mg by mouth daily.    . OMEGA-3 FATTY ACIDS 1000 MG PO CAPS Oral Take 1,200 mg by mouth daily.    Marland Kitchen LOSARTAN POTASSIUM 100 MG PO TABS Oral Take 100 mg by mouth daily.      BP 158/75  Pulse 60   Temp 97.5 F (36.4 C) (Oral)  Resp 18  SpO2 100%  Physical Exam General: Well-developed, well-nourished female in no acute distress; appearance consistent with age of record HENT: normocephalic, atraumatic Eyes: pupils equal round and reactive to light; extraocular muscles intact Neck: supple Heart: regular rate and rhythm Lungs: clear to auscultation bilaterally Abdomen: soft; nondistended; nontender; bowel sounds present GU: Right flank tenderness Extremities: No deformity; full range of motion; pulses normal Neurologic: Awake, alert and oriented; motor function intact in all extremities and symmetric; no facial droop Skin: Warm and dry Psychiatric: Normal mood and affect    ED Course  Procedures (including critical care time)     MDM   Nursing notes and vitals signs, including pulse oximetry, reviewed.  Summary of this visit's results, reviewed by myself:  Labs:  Results for orders placed during the hospital encounter of 07/19/12  CBC WITH DIFFERENTIAL      Component Value Range   WBC 11.7 (*) 4.0 - 10.5 K/uL   RBC 4.28  3.87 - 5.11 MIL/uL   Hemoglobin 12.9  12.0 - 15.0 g/dL   HCT 11.9  14.7 - 82.9 %   MCV 88.3  78.0 - 100.0 fL   MCH 30.1  26.0 -  34.0 pg   MCHC 34.1  30.0 - 36.0 g/dL   RDW 16.1  09.6 - 04.5 %   Platelets 398  150 - 400 K/uL   Neutrophils Relative 78 (*) 43 - 77 %   Neutro Abs 9.1 (*) 1.7 - 7.7 K/uL   Lymphocytes Relative 14  12 - 46 %   Lymphs Abs 1.6  0.7 - 4.0 K/uL   Monocytes Relative 7  3 - 12 %   Monocytes Absolute 0.8  0.1 - 1.0 K/uL   Eosinophils Relative 1  0 - 5 %   Eosinophils Absolute 0.1  0.0 - 0.7 K/uL   Basophils Relative 0  0 - 1 %   Basophils Absolute 0.0  0.0 - 0.1 K/uL  BASIC METABOLIC PANEL      Component Value Range   Sodium 139  135 - 145 mEq/L   Potassium 4.9  3.5 - 5.1 mEq/L   Chloride 105  96 - 112 mEq/L   CO2 22  19 - 32 mEq/L   Glucose, Bld 144 (*) 70 - 99 mg/dL   BUN 30 (*) 6 - 23 mg/dL   Creatinine, Ser  4.09 (*) 0.50 - 1.10 mg/dL   Calcium 9.8  8.4 - 81.1 mg/dL   GFR calc non Af Amer 40 (*) >90 mL/min   GFR calc Af Amer 46 (*) >90 mL/min  LIPASE, BLOOD      Component Value Range   Lipase 70 (*) 11 - 59 U/L  URINALYSIS, ROUTINE W REFLEX MICROSCOPIC      Component Value Range   Color, Urine YELLOW  YELLOW   APPearance CLOUDY (*) CLEAR   Specific Gravity, Urine 1.024  1.005 - 1.030   pH 5.5  5.0 - 8.0   Glucose, UA NEGATIVE  NEGATIVE mg/dL   Hgb urine dipstick NEGATIVE  NEGATIVE   Bilirubin Urine NEGATIVE  NEGATIVE   Ketones, ur NEGATIVE  NEGATIVE mg/dL   Protein, ur NEGATIVE  NEGATIVE mg/dL   Urobilinogen, UA 0.2  0.0 - 1.0 mg/dL   Nitrite NEGATIVE  NEGATIVE   Leukocytes, UA MODERATE (*) NEGATIVE  URINE MICROSCOPIC-ADD ON      Component Value Range   Squamous Epithelial / LPF FEW (*) RARE   WBC, UA 7-10  <3 WBC/hpf   Bacteria, UA FEW (*) RARE   Urine-Other LESS THAN 10 mL OF URINE SUBMITTED      Imaging Studies: Ct Abdomen Pelvis Wo Contrast  07/19/2012  *RADIOLOGY REPORT*  Clinical Data: Right flank pain, nausea, elevated lipase.  Mildly elevated white blood cell count  CT ABDOMEN AND PELVIS WITHOUT CONTRAST  Technique:  Multidetector CT imaging of the abdomen and pelvis was performed following the standard protocol without intravenous contrast.  Comparison: Ultrasound, 07/21/2010  Findings:  Renal: There is hydronephrosis and hydroureter of the right kidney. This is secondary to a partially obstructing calculus at the right vesicoureteral junction.  The calculus is on the bladder side of the vesicoureteral junction measuring 2 mm (image 87).  There is mild fluid around the right kidney.  Within the left kidney there is a group of course calcifications in the mid kidney measuring up to 2.5 cm and numbering approximately three calculi.  The smallest measure approximately 9 mm.  No hydroureter or ureterolithiasis on the left.  Peripelvic cysts are present on the left.  Mild scarring at  the lung bases.  No focal hepatic lesion.  Large 18 mm gallstone in the gallbladder.  The pancreas, spleen,  adrenal glands are normal.  The stomach, small bowel, appendix, and cecum are normal.  The colon rectosigmoid colon are normal.  Abdominal aorta normal caliber.  Uterus is normal.  The ovaries are normal.  No pelvic lymphadenopathy. Review of  bone windows demonstrates no aggressive osseous lesions.  IMPRESSION:  1.  Obstructing calculus within the distal right ureter at the vesicle junction (bladder side).  There is hydronephrosis and perinephric stranding of the right kidney. 2.   Multiple coarse calcifications in the left kidney.  No evidence of obstruction.   Original Report Authenticated By: Genevive Bi, M.D.     7:32 AM Discussed with Dr. Vernie Ammons who advises we place the patient on Cipro, Flomax and an analgesic. He anticipates rapid passage the stone given its current location.          Hanley Seamen, MD 07/19/12 [phone]

## 2012-07-19 NOTE — ED Notes (Signed)
Pt c/o RLQ pain radiating to R flank onset last evening @ 2200 while watching TV. Pt c/o nausea, denies v/d.

## 2012-07-19 NOTE — ED Notes (Signed)
Patient transported to CT 

## 2012-07-19 NOTE — ED Notes (Signed)
Pt reports rt-sided abd pain that began this evening w/ some nausea - denies vomiting or diarrhea - LNBM yesterday evening. Pt w/ guarding and grimacing on assessment, in no acute distress A&Ox4. Pt also reports urinary frequency as well.

## 2012-07-23 NOTE — ED Notes (Addendum)
Urine culture only 20,000 colonies.

## 2012-09-03 ENCOUNTER — Other Ambulatory Visit: Payer: Self-pay | Admitting: Internal Medicine

## 2012-09-03 DIAGNOSIS — Z1231 Encounter for screening mammogram for malignant neoplasm of breast: Secondary | ICD-10-CM

## 2012-09-11 ENCOUNTER — Ambulatory Visit
Admission: RE | Admit: 2012-09-11 | Discharge: 2012-09-11 | Disposition: A | Discharge status: Home / Self Care | Payer: 59 | Source: Ambulatory Visit | Attending: Internal Medicine | Admitting: Internal Medicine

## 2012-09-11 DIAGNOSIS — Z1231 Encounter for screening mammogram for malignant neoplasm of breast: Secondary | ICD-10-CM

## 2013-05-17 ENCOUNTER — Other Ambulatory Visit: Payer: Self-pay | Admitting: Internal Medicine

## 2013-05-17 DIAGNOSIS — R1031 Right lower quadrant pain: Secondary | ICD-10-CM

## 2013-05-23 ENCOUNTER — Ambulatory Visit
Admission: RE | Admit: 2013-05-23 | Discharge: 2013-05-23 | Disposition: A | Discharge status: Home / Self Care | Payer: Medicare Other | Source: Ambulatory Visit | Attending: Internal Medicine | Admitting: Internal Medicine

## 2013-05-23 DIAGNOSIS — R1031 Right lower quadrant pain: Secondary | ICD-10-CM

## 2013-06-06 ENCOUNTER — Other Ambulatory Visit: Payer: Self-pay | Admitting: Internal Medicine

## 2013-06-06 DIAGNOSIS — R1031 Right lower quadrant pain: Secondary | ICD-10-CM

## 2013-06-11 ENCOUNTER — Ambulatory Visit
Admission: RE | Admit: 2013-06-11 | Discharge: 2013-06-11 | Disposition: A | Discharge status: Home / Self Care | Payer: Medicare Other | Source: Ambulatory Visit | Attending: Internal Medicine | Admitting: Internal Medicine

## 2013-06-11 DIAGNOSIS — R1031 Right lower quadrant pain: Secondary | ICD-10-CM

## 2013-06-11 MED ORDER — IOHEXOL 300 MG/ML  SOLN
100.0000 mL | Freq: Once | INTRAMUSCULAR | Status: AC | PRN
  Administered 2013-06-11: 100 mL via INTRAVENOUS

## 2013-09-04 ENCOUNTER — Other Ambulatory Visit: Payer: Self-pay

## 2013-09-04 DIAGNOSIS — Z1231 Encounter for screening mammogram for malignant neoplasm of breast: Secondary | ICD-10-CM

## 2013-10-09 ENCOUNTER — Ambulatory Visit
Admission: RE | Admit: 2013-10-09 | Discharge: 2013-10-09 | Disposition: A | Discharge status: Home / Self Care | Payer: Medicare Other | Source: Ambulatory Visit

## 2013-10-09 DIAGNOSIS — Z1231 Encounter for screening mammogram for malignant neoplasm of breast: Secondary | ICD-10-CM

## 2014-01-10 ENCOUNTER — Ambulatory Visit: Payer: Medicare Other | Admitting: Cardiovascular Disease

## 2014-02-04 ENCOUNTER — Encounter: Payer: Self-pay | Admitting: Cardiovascular Disease

## 2014-02-04 ENCOUNTER — Ambulatory Visit (INDEPENDENT_AMBULATORY_CARE_PROVIDER_SITE_OTHER): Payer: Medicare Other | Admitting: Cardiovascular Disease

## 2014-02-04 VITALS — BP 160/82 | HR 56 | Ht 66.0 in | Wt 139.6 lb

## 2014-02-04 DIAGNOSIS — I10 Essential (primary) hypertension: Secondary | ICD-10-CM | POA: Insufficient documentation

## 2014-02-04 DIAGNOSIS — I2510 Atherosclerotic heart disease of native coronary artery without angina pectoris: Secondary | ICD-10-CM

## 2014-02-04 DIAGNOSIS — E785 Hyperlipidemia, unspecified: Secondary | ICD-10-CM

## 2014-02-04 NOTE — Progress Notes (Signed)
     02/04/2014 [name]   09/27/1941  [id number]  Primary Physician [name],Casey R., MD Primary Cardiologist: Casey GessJonathan J. Casey Egolf MD [name]   HPI:  The patient is a 73 year old, thin-appearing, married Caucasian female, mother of 3, grandmother to 3 grandchildren and great-grandmother to 1 great-grandchild whose husband Casey SorrowJerry is also a patient of mine. I saw her a year ago. She has a history of CAD status post staged PCI and stenting of her LAD and RCA with drug-eluting stents back in 2006. Her other problems include hypertension and hyperlipidemia. Dr. Renae Myers follows her lipid profile. She is otherwise asymptomatic. Dr. Renae Myers does follow her lipid profile closely. She it is a statin intolerant.    Current Outpatient Prescriptions  Medication Sig Dispense Refill  . aspirin 81 MG chewable tablet Chew 81 mg by mouth daily.      . cloNIDine (CATAPRES) 0.2 MG tablet Take 0.2 mg by mouth daily.      . fenofibrate 160 MG tablet Take 160 mg by mouth daily.      Marland Kitchen. losartan (COZAAR) 100 MG tablet Take 100 mg by mouth daily.       No current facility-administered medications for this visit.    Allergies  Allergen Reactions  . Codeine Nausea And Vomiting    History   Social History  . Marital Status: Married    Spouse Name: N/A    Number of Children: N/A  . Years of Education: N/A   Occupational History  . Not on file.   Social History Main Topics  . Smoking status: Never Smoker   . Smokeless tobacco: Not on file  . Alcohol Use: No  . Drug Use: No  . Sexual Activity:    Other Topics Concern  . Not on file   Social History Narrative  . No narrative on file     Review of Systems: General: negative for chills, fever, night sweats or weight changes.  Cardiovascular: negative for chest pain, dyspnea on exertion, edema, orthopnea, palpitations, paroxysmal nocturnal dyspnea or shortness of breath Dermatological: negative for rash Respiratory: negative  for cough or wheezing Urologic: negative for hematuria Abdominal: negative for nausea, vomiting, diarrhea, bright red blood per rectum, melena, or hematemesis Neurologic: negative for visual changes, syncope, or dizziness All other systems reviewed and are otherwise negative except as noted above.    Blood pressure 160/82, pulse 56, height 5\' 6"  (1.676 m), weight 139 lb 9.6 oz (63.322 kg).  General appearance: alert and no distress Neck: no adenopathy, no carotid bruit, no JVD, supple, symmetrical, trachea midline and thyroid not enlarged, symmetric, no tenderness/mass/nodules Lungs: clear to auscultation bilaterally Heart: regular rate and rhythm, S1, S2 normal, no murmur, click, rub or gallop Extremities: extremities normal, atraumatic, no cyanosis or edema  EKG sinus bradycardia of 56 without ST or T wave changes  ASSESSMENT AND PLAN:   Coronary artery disease Status post staged PCI and stenting of her LAD and RCA with drug-eluting stents back in 2006. Her last Myoview stress test performed 11/15/11 was nonischemic. She denies chest pain or shortness of breath.  Essential hypertension Controlled on current medications  Hyperlipidemia Statin intolerant on fenofibrate followed by her primary care physician      Casey GessJonathan J. Demetrius Mahler MD [facility] HospitalFACP,FACC,FAHA, [facility] EastFSCAI 02/04/2014 9:00 AM

## 2014-02-04 NOTE — Assessment & Plan Note (Signed)
Statin intolerant on fenofibrate followed by her primary care physician

## 2014-02-04 NOTE — Assessment & Plan Note (Signed)
Status post staged PCI and stenting of her LAD and RCA with drug-eluting stents back in 2006. Her last Myoview stress test performed 11/15/11 was nonischemic. She denies chest pain or shortness of breath.

## 2014-02-04 NOTE — Patient Instructions (Signed)
Your physician recommends that you schedule a follow-up appointment in: 1 year  

## 2014-02-04 NOTE — Assessment & Plan Note (Signed)
Controlled on current medications 

## 2014-03-11 ENCOUNTER — Other Ambulatory Visit: Payer: Self-pay | Admitting: Internal Medicine

## 2014-03-11 DIAGNOSIS — R1032 Left lower quadrant pain: Secondary | ICD-10-CM

## 2014-03-14 ENCOUNTER — Ambulatory Visit
Admission: RE | Admit: 2014-03-14 | Discharge: 2014-03-14 | Disposition: A | Discharge status: Home / Self Care | Payer: Medicare Other | Source: Ambulatory Visit | Attending: Internal Medicine | Admitting: Internal Medicine

## 2014-03-14 DIAGNOSIS — R1032 Left lower quadrant pain: Secondary | ICD-10-CM

## 2014-03-14 MED ORDER — IOHEXOL 300 MG/ML  SOLN
100.0000 mL | Freq: Once | INTRAMUSCULAR | Status: AC | PRN
  Administered 2014-03-14: 100 mL via INTRAVENOUS

## 2014-10-06 ENCOUNTER — Other Ambulatory Visit: Payer: Self-pay

## 2014-10-06 DIAGNOSIS — Z1231 Encounter for screening mammogram for malignant neoplasm of breast: Secondary | ICD-10-CM

## 2014-10-29 ENCOUNTER — Ambulatory Visit: Payer: Medicare Other

## 2014-11-18 ENCOUNTER — Ambulatory Visit: Payer: Medicare Other

## 2014-11-25 ENCOUNTER — Ambulatory Visit
Admission: RE | Admit: 2014-11-25 | Discharge: 2014-11-25 | Disposition: A | Discharge status: Home / Self Care | Payer: Medicare Other | Source: Ambulatory Visit

## 2014-11-25 ENCOUNTER — Encounter (INDEPENDENT_AMBULATORY_CARE_PROVIDER_SITE_OTHER): Payer: Self-pay

## 2014-11-25 DIAGNOSIS — Z1231 Encounter for screening mammogram for malignant neoplasm of breast: Secondary | ICD-10-CM

## 2015-01-28 ENCOUNTER — Encounter: Payer: Self-pay | Admitting: Cardiovascular Disease

## 2015-01-28 ENCOUNTER — Ambulatory Visit (INDEPENDENT_AMBULATORY_CARE_PROVIDER_SITE_OTHER): Payer: Medicare Other | Admitting: Cardiovascular Disease

## 2015-01-28 VITALS — BP 122/70 | HR 52 | Ht 66.0 in | Wt 144.2 lb

## 2015-01-28 DIAGNOSIS — E785 Hyperlipidemia, unspecified: Secondary | ICD-10-CM

## 2015-01-28 DIAGNOSIS — I2510 Atherosclerotic heart disease of native coronary artery without angina pectoris: Secondary | ICD-10-CM

## 2015-01-28 DIAGNOSIS — I10 Essential (primary) hypertension: Secondary | ICD-10-CM | POA: Diagnosis not present

## 2015-01-28 DIAGNOSIS — I2583 Coronary atherosclerosis due to lipid rich plaque: Secondary | ICD-10-CM

## 2015-01-28 NOTE — Assessment & Plan Note (Signed)
History of CAD status post PCI stenting of her LAD and RCA with drug eluting stents back in 2006. She denies chest pain or shortness of breath.

## 2015-01-28 NOTE — Patient Instructions (Signed)
Your physician wants you to follow-up in: 1 year with Dr Berry. You will receive a reminder letter in the mail two months in advance. If you don't receive a letter, please call our office to schedule the follow-up appointment.  

## 2015-01-28 NOTE — Assessment & Plan Note (Signed)
History of hyperlipidemia statin intolerance followed by her PCP. I did discuss with her options of PCSK9 monoclonal injectables.

## 2015-01-28 NOTE — Progress Notes (Signed)
     01/28/2015 [name]   07/18/1941  [id number]  Primary Physician Alva GarnetSHELTON,KIMBERLY R., MD Primary Cardiologist: Runell GessJonathan J. Claxton Levitz MD Roseanne RenoFACP,FACC,FAHA, FSCAI   HPI:   The patient is a 74 year old, thin-appearing, married Caucasian female, mother of 3, grandmother to 3 grandchildren and great-grandmother to 1 great-grandchild whose husband [name] is also a patient of mine. I saw her a year ago. She has a history of CAD status post staged PCI and stenting of her LAD and RCA with drug-eluting stents back in 2006. Her other problems include hypertension and hyperlipidemia. Dr. Renae Myers follows her lipid profile. She is statin intolerant. She is otherwise asymptomatic.    Current Outpatient Prescriptions  Medication Sig Dispense Refill  . aspirin 81 MG chewable tablet Chew 81 mg by mouth daily.    . Cholecalciferol 1000 UNITS tablet Take 1,000 Units by mouth daily.    . cloNIDine (CATAPRES) 0.2 MG tablet Take 0.2 mg by mouth daily.    . fenofibrate 160 MG tablet Take 160 mg by mouth daily.    Marland Kitchen. losartan (COZAAR) 100 MG tablet Take 100 mg by mouth daily.     No current facility-administered medications for this visit.    Allergies  Allergen Reactions  . Codeine Nausea And Vomiting    History   Social History  . Marital Status: Married    Spouse Name: N/A  . Number of Children: N/A  . Years of Education: N/A   Occupational History  . Not on file.   Social History Main Topics  . Smoking status: Never Smoker   . Smokeless tobacco: Not on file  . Alcohol Use: No  . Drug Use: No  . Sexual Activity: Not on file   Other Topics Concern  . Not on file   Social History Narrative     Review of Systems: General: negative for chills, fever, night sweats or weight changes.  Cardiovascular: negative for chest pain, dyspnea on exertion, edema, orthopnea, palpitations, paroxysmal nocturnal dyspnea or shortness of breath Dermatological: negative for rash Respiratory: negative for  cough or wheezing Urologic: negative for hematuria Abdominal: negative for nausea, vomiting, diarrhea, bright red blood per rectum, melena, or hematemesis Neurologic: negative for visual changes, syncope, or dizziness All other systems reviewed and are otherwise negative except as noted above.    Blood pressure 122/70, pulse 52, height 5\' 6"  (1.676 m), weight 144 lb 3.2 oz (65.409 kg).  General appearance: alert and no distress Neck: no adenopathy, no carotid bruit, no JVD, supple, symmetrical, trachea midline and thyroid not enlarged, symmetric, no tenderness/mass/nodules Lungs: clear to auscultation bilaterally Heart: regular rate and rhythm, S1, S2 normal, no murmur, click, rub or gallop Extremities: extremities normal, atraumatic, no cyanosis or edema  EKG sinus bradycardia 52 with a ST or T-wave changes. I personally reviewed this EKG  ASSESSMENT AND PLAN:   Hyperlipidemia History of hyperlipidemia statin intolerance followed by her PCP. I did discuss with her options of PCSK9 monoclonal injectables.   Essential hypertension History of hypertension with blood pressure measured at 122/70. She is on losartan and clonidine. Continue current meds at current dosing   Coronary artery disease History of CAD status post PCI stenting of her LAD and RCA with drug eluting stents back in 2006. She denies chest pain or shortness of breath.       Runell GessJonathan J. Naman Spychalski MD FACP,FACC,FAHA, [facility]CFSCAI 01/28/2015 9:50 AM

## 2015-01-28 NOTE — Assessment & Plan Note (Signed)
History of hypertension with blood pressure measured at 122/70. She is on losartan and clonidine. Continue current meds at current dosing

## 2015-08-12 ENCOUNTER — Ambulatory Visit: Payer: Self-pay

## 2015-10-21 ENCOUNTER — Other Ambulatory Visit: Payer: Self-pay

## 2015-10-21 DIAGNOSIS — Z1231 Encounter for screening mammogram for malignant neoplasm of breast: Secondary | ICD-10-CM

## 2015-11-27 ENCOUNTER — Ambulatory Visit
Admission: RE | Admit: 2015-11-27 | Discharge: 2015-11-27 | Disposition: A | Discharge status: Home / Self Care | Payer: Medicare Other | Source: Ambulatory Visit

## 2015-11-27 ENCOUNTER — Other Ambulatory Visit: Payer: Self-pay | Admitting: Internal Medicine

## 2015-11-27 DIAGNOSIS — R928 Other abnormal and inconclusive findings on diagnostic imaging of breast: Secondary | ICD-10-CM

## 2015-11-27 DIAGNOSIS — Z1231 Encounter for screening mammogram for malignant neoplasm of breast: Secondary | ICD-10-CM

## 2015-12-04 ENCOUNTER — Ambulatory Visit
Admission: RE | Admit: 2015-12-04 | Discharge: 2015-12-04 | Disposition: A | Discharge status: Home / Self Care | Payer: Medicare Other | Source: Ambulatory Visit | Attending: Internal Medicine | Admitting: Internal Medicine

## 2015-12-04 DIAGNOSIS — R928 Other abnormal and inconclusive findings on diagnostic imaging of breast: Secondary | ICD-10-CM

## 2016-04-20 ENCOUNTER — Ambulatory Visit (INDEPENDENT_AMBULATORY_CARE_PROVIDER_SITE_OTHER): Payer: Medicare Other | Admitting: Family Medicine

## 2016-04-20 VITALS — BP 160/82 | HR 59 | Temp 97.6°F | Resp 16 | Ht 66.0 in | Wt 137.0 lb

## 2016-04-20 DIAGNOSIS — R030 Elevated blood-pressure reading, without diagnosis of hypertension: Secondary | ICD-10-CM | POA: Diagnosis not present

## 2016-04-20 DIAGNOSIS — R0789 Other chest pain: Secondary | ICD-10-CM

## 2016-04-20 DIAGNOSIS — IMO0001 Reserved for inherently not codable concepts without codable children: Secondary | ICD-10-CM

## 2016-04-20 DIAGNOSIS — B029 Zoster without complications: Secondary | ICD-10-CM

## 2016-04-20 DIAGNOSIS — R21 Rash and other nonspecific skin eruption: Secondary | ICD-10-CM

## 2016-04-20 MED ORDER — VALACYCLOVIR HCL 1 G PO TABS: 1000.0000 mg | ORAL_TABLET | Freq: Three times a day (TID) | ORAL | Status: DC

## 2016-04-20 MED ORDER — HYDROCODONE-ACETAMINOPHEN 5-325 MG PO TABS: 1.0000 | ORAL_TABLET | Freq: Four times a day (QID) | ORAL | Status: DC | PRN

## 2016-04-20 NOTE — Progress Notes (Signed)
Subjective:  By signing my name below, I, Stann Ore, attest that this documentation has been prepared under the direction and in the presence of Meredith Staggers, MD. Electronically Signed: Stann Ore, Scribe. 04/20/2016 , 11:16 AM .  Patient was seen in Room 10 .   Patient ID: [name], female    DOB: Mar 21, 1941, 75 y.o.   MRN: [id number] Chief Complaint  Patient presents with  . Rash    on left upper arm/chest, painful,  concern about shingles   HPI [name] is a 75 y.o. female  Patient reports having a painful rash over her left upper arm and left upper chest noticed 2 days ago. She informs not feeling anything the night prior. She initially felt itching from the little bumps and some pain when she scratched the affected areas. She mentions it looked better yesterday, but still had some pain. She applied alcohol to the area without relief. She also noticed having a small affected area on the back of her neck around the same time. She has history of shingles. She denies any other rash down her arm. She denies applying cream or lotion.   Patient Active Problem List   Diagnosis Date Noted  . Coronary artery disease 02/04/2014  . Essential hypertension 02/04/2014  . Hyperlipidemia 02/04/2014   Past Medical History  Diagnosis Date  . Coronary artery disease   . Hypertension   . Hypercholesteremia   . Syncope     Thought to be related to dehydration in the setting of ongoing antihypertensive use  . History of vertigo   . Normocytic anemia   . Hyperlipidemia   . H/O: hysterectomy   . History of palpitations    Past Surgical History  Procedure Laterality Date  . Coronary angioplasty with stent placement    . Appendectomy    . Cholecystectomy     Allergies  Allergen Reactions  . Codeine Nausea And Vomiting   Prior to Admission medications   Medication Sig Start Date End Date Taking? Authorizing Provider  aspirin 81 MG chewable tablet Chew 81 mg by mouth  daily.   Yes Historical Provider, MD  Cholecalciferol 1000 UNITS tablet Take 1,000 Units by mouth daily.   Yes Historical Provider, MD  cloNIDine (CATAPRES) 0.2 MG tablet Take 0.2 mg by mouth daily.   Yes Historical Provider, MD  Coenzyme Q10 (CO Q 10) 10 MG CAPS Take by mouth.   Yes Historical Provider, MD  fenofibrate 160 MG tablet Take 160 mg by mouth daily.   Yes Historical Provider, MD  losartan (COZAAR) 100 MG tablet Take 100 mg by mouth daily.   Yes Historical Provider, MD   Social History   Social History  . Marital Status: Married    Spouse Name: N/A  . Number of Children: N/A  . Years of Education: N/A   Occupational History  . Not on file.   Social History Main Topics  . Smoking status: Never Smoker   . Smokeless tobacco: Not on file  . Alcohol Use: No  . Drug Use: No  . Sexual Activity: Not on file   Other Topics Concern  . Not on file   Social History Narrative   Review of Systems  Constitutional: Negative for fever, chills and fatigue.  Respiratory: Negative for cough, chest tightness, shortness of breath and wheezing.   Gastrointestinal: Negative for nausea, vomiting, diarrhea and constipation.  Skin: Positive for rash. Negative for wound.       Objective:  Physical Exam  Constitutional: She is oriented to person, place, and time. She appears well-developed and well-nourished. No distress.  HENT:  Head: Normocephalic and atraumatic.  Eyes: EOM are normal. Pupils are equal, round, and reactive to light.  Neck: Neck supple.  Back of the scalp, there are 3-4 small vesicles with faint erythematous base versus urticaria   Cardiovascular: Normal rate.   Pulmonary/Chest: Effort normal. No respiratory distress.  Musculoskeletal: Normal range of motion.  Neurological: She is alert and oriented to person, place, and time.  Skin: Skin is warm and dry.  Multiple patches of erythema with overlying clear vesicles over left upper outer chest and left anterior  shoulder  Psychiatric: She has a normal mood and affect. Her behavior is normal.  Nursing note and vitals reviewed.   Filed Vitals:   04/20/16 1052  BP: 160/82  Pulse: 59  Temp: 97.6 F (36.4 C)  TempSrc: Oral  Resp: 16  Height: 5\' 6"  (1.676 m)  Weight: 137 lb (62.143 kg)  SpO2: 97%      Assessment & Plan:   [name] is a 75 y.o. female Shingles - Plan: valACYclovir (VALTREX) 1000 MG tablet, HYDROcodone-acetaminophen (NORCO/VICODIN) 5-325 MG tablet Rash and nonspecific skin eruption - Plan: valACYclovir (VALTREX) 1000 MG tablet, HYDROcodone-acetaminophen (NORCO/VICODIN) 5-325 MG tablet Chest wall pain  - Suspected shingles based on onset of symptoms, but appears to cross a few dermatomes. HSV 1 also possible.  -Start Valtrex 1 g 3 times a day for 1 week, Tylenol over-the-counter, or hydrocodone if needed for increased pain. Discussed allergy to codeine, and if any nausea or vomiting with hydrocodone, can stop and consider tramadol  - Contact precautions discussed, RTC precautions.  Elevated blood pressure  - Asymptomatic. Advised to monitor outside of office and if remains elevated, follow up with PCP or cardiologist.  Meds ordered this encounter  Medications  . Coenzyme Q10 (CO Q 10) 10 MG CAPS    Sig: Take by mouth.  . valACYclovir (VALTREX) 1000 MG tablet    Sig: Take 1 tablet (1,000 mg total) by mouth 3 (three) times daily.    Dispense:  21 tablet    Refill:  0  . HYDROcodone-acetaminophen (NORCO/VICODIN) 5-325 MG tablet    Sig: Take 1 tablet by mouth every 6 (six) hours as needed for moderate pain.    Dispense:  15 tablet    Refill:  0   Patient Instructions       IF you received an x-ray today, you will receive an invoice from [facility]. Please contact [facility] at [phone] with questions or concerns regarding your invoice.   IF you received labwork today, you will receive an invoice from [employer].  Please contact [facility] at [phone] with questions or concerns regarding your invoice.   Our billing staff will not be able to assist you with questions regarding bills from these companies.  You will be contacted with the lab results as soon as they are available. The fastest way to get your results is to activate your My Chart account. Instructions are located on the last page of this paperwork. If you have not heard from [place] regarding the results in 2 weeks, please contact this office.    We recommend that you schedule a mammogram for breast cancer screening. Typically, you do not need a referral to do this. Please contact a local imaging center to schedule your mammogram.  [facility] - [phone]  *ask for the  Radiology Department [facility]) - [phone] or [phone]  [facility] - [phone] [facility] - [phone] [facility] - [phone]  *ask for the Radiology Department [facility] - [phone]  *ask for the Radiology Department [facility] - [phone]  *ask for the Mammography Department [facility] - [phone]  Your rash appears to be due to shingles, but with the location of it, it may also be another herpes virus or the cold sore virus (that can present on upper body as well). These are both treated with the same medication. Start Valtrex 1 pill 3 times per day for the next 1 week to cover for shingles. Tylenol over-the-counter is fine. If you need something stronger, did prescribe hydrocodone. Be very careful with this medication as it can cause sedation and dizziness. Return if any worsening of symptoms.  Your blood pressure was slightly elevated here in the office. Keep an eye on this outside the office, and if remaining over 140/90, discuss with your primary care provider or cardiologist.  Shingles Shingles, which is also known as herpes  zoster, is an infection that causes a painful skin rash and fluid-filled blisters. Shingles is not related to genital herpes, which is a sexually transmitted infection.   Shingles only develops in people who:  Have had chickenpox.  Have received the chickenpox vaccine. (This is rare.) CAUSES Shingles is caused by varicella-zoster virus (VZV). This is the same virus that causes chickenpox. After exposure to VZV, the virus stays in the body in an inactive (dormant) state. Shingles develops if the virus reactivates. This can happen many years after the initial exposure to VZV. It is not known what causes this virus to reactivate. RISK FACTORS People who have had chickenpox or received the chickenpox vaccine are at risk for shingles. Infection is more common in people who:  Are older than age 48.  Have a weakened defense (immune) system, such as those with HIV, AIDS, or cancer.  Are taking medicines that weaken the immune system, such as transplant medicines.  Are under great stress. SYMPTOMS Early symptoms of this condition include itching, tingling, and pain in an area on your skin. Pain may be described as burning, stabbing, or throbbing. A few days or weeks after symptoms start, a painful red rash appears, usually on one side of the body in a bandlike or beltlike pattern. The rash eventually turns into fluid-filled blisters that break open, scab over, and dry up in about 2-3 weeks. At any time during the infection, you may also develop:  A fever.  Chills.  A headache.  An upset stomach. DIAGNOSIS This condition is diagnosed with a skin exam. Sometimes, skin or fluid samples are taken from the blisters before a diagnosis is made. These samples are examined under a microscope or sent to a lab for testing. TREATMENT There is no specific cure for this condition. Your health care provider will probably prescribe medicines to help you manage pain, recover more quickly, and avoid long-term  problems. Medicines may include:  Antiviral drugs.  Anti-inflammatory drugs.  Pain medicines. If the area involved is on your face, you may be referred to a specialist, such as an eye doctor (ophthalmologist) or an ear, nose, and throat (ENT) doctor to help you avoid eye problems, chronic pain, or disability. HOME CARE INSTRUCTIONS Medicines  Take medicines only as directed by your health care provider.  Apply an anti-itch or numbing  cream to the affected area as directed by your health care provider. Blister and Rash Care  Take a cool bath or apply cool compresses to the area of the rash or blisters as directed by your health care provider. This may help with pain and itching.  Keep your rash covered with a loose bandage (dressing). Wear loose-fitting clothing to help ease the pain of material rubbing against the rash.  Keep your rash and blisters clean with mild soap and cool water or as directed by your health care provider.  Check your rash every day for signs of infection. These include redness, swelling, and pain that lasts or increases.  Do not pick your blisters.  Do not scratch your rash. General Instructions  Rest as directed by your health care provider.  Keep all follow-up visits as directed by your health care provider. This is important.  Until your blisters scab over, your infection can cause chickenpox in people who have never had it or been vaccinated against it. To prevent this from happening, avoid contact with other people, especially:  Babies.  Pregnant women.  Children who have eczema.  Elderly people who have transplants.  People who have chronic illnesses, such as leukemia or AIDS. SEEK MEDICAL CARE IF:  Your pain is not relieved with prescribed medicines.  Your pain does not get better after the rash heals.  Your rash looks infected. Signs of infection include redness, swelling, and pain that lasts or increases. SEEK IMMEDIATE MEDICAL CARE  IF:  The rash is on your face or nose.  You have facial pain, pain around your eye area, or loss of feeling on one side of your face.  You have ear pain or you have ringing in your ear.  You have loss of taste.  Your condition gets worse.   This information is not intended to replace advice given to you by your health care provider. Make sure you discuss any questions you have with your health care provider.   Document Released: 10/24/2005 Document Revised: 11/14/2014 Document Reviewed: 09/04/2014 Elsevier Interactive Patient Education [employer]2016 Elsevier Inc.       I personally performed the services described in this documentation, which was scribed in my presence. The recorded information has been reviewed and considered, and addended by me as needed.   Signed,   Meredith StaggersJeffrey Amarion Portell, MD [facility].  04/20/2016 11:29 AM

## 2016-04-20 NOTE — Patient Instructions (Addendum)
IF you received an x-ray today, you will receive an invoice from [facility]. Please contact [facility] at [phone] with questions or concerns regarding your invoice.   IF you received labwork today, you will receive an invoice from [facility]. Please contact [facility] at [phone] with questions or concerns regarding your invoice.   Our billing staff will not be able to assist you with questions regarding bills from these companies.  You will be contacted with the lab results as soon as they are available. The fastest way to get your results is to activate your My Chart account. Instructions are located on the last page of this paperwork. If you have not heard from us regarding the results in 2 weeks, please contact this office.    We recommend that you schedule a mammogram for breast cancer screening. Typically, you do not need a referral to do this. Please contact a local imaging center to schedule your mammogram.  [facility] - [phone](336) [phone]  *ask for the Radiology Department [facility]) - [phone]) [phone] or [phone]  [facility] - [phone] [facility] - [phone] [facility] - [phone]  *ask for the Radiology Department [facility] - [phone](336) [phone]  *ask for the Radiology Department [facility] - [phone]  *ask for the Mammography Department [facility] - [phone]  Your rash appears to be due to shingles, but with the location of it, it may also be another herpes virus or the cold sore virus (that can present on upper body as well). These are both treated with the same medication. Start Valtrex 1 pill 3 times per day for the next 1 week to cover for shingles. Tylenol over-the-counter is fine. If you need something stronger, did prescribe hydrocodone. Be very careful with this medication as it can cause  sedation and dizziness. Return if any worsening of symptoms.  Your blood pressure was slightly elevated here in the office. Keep an eye on this outside the office, and if remaining over 140/90, discuss with your primary care provider or cardiologist.  Shingles Shingles, which is also known as herpes zoster, is an infection that causes a painful skin rash and fluid-filled blisters. Shingles is not related to genital herpes, which is a sexually transmitted infection.   Shingles only develops in people who:  Have had chickenpox.  Have received the chickenpox vaccine. (This is rare.) CAUSES Shingles is caused by varicella-zoster virus (VZV). This is the same virus that causes chickenpox. After exposure to VZV, the virus stays in the body in an inactive (dormant) state. Shingles develops if the virus reactivates. This can happen many years after the initial exposure to VZV. It is not known what causes this virus to reactivate. RISK FACTORS People who have had chickenpox or received the chickenpox vaccine are at risk for shingles. Infection is more common in people who:  Are older than age 75.  Have a weakened defense (immune) system, such as those with HIV, AIDS, or cancer.  Are taking medicines that weaken the immune system, such as transplant medicines.  Are under great stress. SYMPTOMS Early symptoms of this condition include itching, tingling, and pain in an area on your skin. Pain may be described as burning, stabbing, or throbbing. A few days or weeks after symptoms start, a painful red rash appears, usually on one side of the body in a bandlike or beltlike pattern. The rash  eventually turns into fluid-filled blisters that break open, scab over, and dry up in about 2-3 weeks. At any time during the infection, you may also develop:  A fever.  Chills.  A headache.  An upset stomach. DIAGNOSIS This condition is diagnosed with a skin exam. Sometimes, skin or fluid samples are taken  from the blisters before a diagnosis is made. These samples are examined under a microscope or sent to a lab for testing. TREATMENT There is no specific cure for this condition. Your health care provider will probably prescribe medicines to help you manage pain, recover more quickly, and avoid long-term problems. Medicines may include:  Antiviral drugs.  Anti-inflammatory drugs.  Pain medicines. If the area involved is on your face, you may be referred to a specialist, such as an eye doctor (ophthalmologist) or an ear, nose, and throat (ENT) doctor to help you avoid eye problems, chronic pain, or disability. HOME CARE INSTRUCTIONS Medicines  Take medicines only as directed by your health care provider.  Apply an anti-itch or numbing cream to the affected area as directed by your health care provider. Blister and Rash Care  Take a cool bath or apply cool compresses to the area of the rash or blisters as directed by your health care provider. This may help with pain and itching.  Keep your rash covered with a loose bandage (dressing). Wear loose-fitting clothing to help ease the pain of material rubbing against the rash.  Keep your rash and blisters clean with mild soap and cool water or as directed by your health care provider.  Check your rash every day for signs of infection. These include redness, swelling, and pain that lasts or increases.  Do not pick your blisters.  Do not scratch your rash. General Instructions  Rest as directed by your health care provider.  Keep all follow-up visits as directed by your health care provider. This is important.  Until your blisters scab over, your infection can cause chickenpox in people who have never had it or been vaccinated against it. To prevent this from happening, avoid contact with other people, especially:  Babies.  Pregnant women.  Children who have eczema.  Elderly people who have transplants.  People who have chronic  illnesses, such as leukemia or AIDS. SEEK MEDICAL CARE IF:  Your pain is not relieved with prescribed medicines.  Your pain does not get better after the rash heals.  Your rash looks infected. Signs of infection include redness, swelling, and pain that lasts or increases. SEEK IMMEDIATE MEDICAL CARE IF:  The rash is on your face or nose.  You have facial pain, pain around your eye area, or loss of feeling on one side of your face.  You have ear pain or you have ringing in your ear.  You have loss of taste.  Your condition gets worse.   This information is not intended to replace advice given to you by your health care provider. Make sure you discuss any questions you have with your health care provider.   Document Released: 10/24/2005 Document Revised: 11/14/2014 Document Reviewed: 09/04/2014 Elsevier Interactive Patient Education [employer]

## 2016-07-23 ENCOUNTER — Emergency Department (HOSPITAL_COMMUNITY)
Admission: EM | Admit: 2016-07-23 | Discharge: 2016-07-23 | Disposition: A | Discharge status: Home / Self Care | Payer: Medicare Other | Attending: Emergency Medicine | Admitting: Emergency Medicine

## 2016-07-23 ENCOUNTER — Encounter (HOSPITAL_COMMUNITY): Payer: Self-pay | Admitting: Emergency Medicine

## 2016-07-23 DIAGNOSIS — I2510 Atherosclerotic heart disease of native coronary artery without angina pectoris: Secondary | ICD-10-CM | POA: Diagnosis not present

## 2016-07-23 DIAGNOSIS — Z7982 Long term (current) use of aspirin: Secondary | ICD-10-CM | POA: Insufficient documentation

## 2016-07-23 DIAGNOSIS — Z79899 Other long term (current) drug therapy: Secondary | ICD-10-CM | POA: Diagnosis not present

## 2016-07-23 DIAGNOSIS — Z955 Presence of coronary angioplasty implant and graft: Secondary | ICD-10-CM | POA: Insufficient documentation

## 2016-07-23 DIAGNOSIS — R55 Syncope and collapse: Secondary | ICD-10-CM | POA: Insufficient documentation

## 2016-07-23 DIAGNOSIS — I10 Essential (primary) hypertension: Secondary | ICD-10-CM | POA: Insufficient documentation

## 2016-07-23 LAB — CBC
HEMATOCRIT: 32.8 % — AB (ref 36.0–46.0)
Hemoglobin: 10.5 g/dL — ABNORMAL LOW (ref 12.0–15.0)
MCH: 29.5 pg (ref 26.0–34.0)
MCHC: 32 g/dL (ref 30.0–36.0)
MCV: 92.1 fL (ref 78.0–100.0)
Platelets: 167 10*3/uL (ref 150–400)
RBC: 3.56 MIL/uL — ABNORMAL LOW (ref 3.87–5.11)
RDW: 13.9 % (ref 11.5–15.5)
WBC: 6.1 10*3/uL (ref 4.0–10.5)

## 2016-07-23 LAB — BASIC METABOLIC PANEL
Anion gap: 4 — ABNORMAL LOW (ref 5–15)
BUN: 23 mg/dL — AB (ref 6–20)
CHLORIDE: 114 mmol/L — AB (ref 101–111)
CO2: 23 mmol/L (ref 22–32)
Calcium: 9 mg/dL (ref 8.9–10.3)
Creatinine, Ser: 0.89 mg/dL (ref 0.44–1.00)
GFR calc Af Amer: >60 mL/min (ref >60)
GFR calc non Af Amer: >60 mL/min (ref >60)
GLUCOSE: 147 mg/dL — AB (ref 65–99)
POTASSIUM: 3.4 mmol/L — AB (ref 3.5–5.1)
Sodium: 141 mmol/L (ref 135–145)

## 2016-07-23 LAB — CBG MONITORING, ED: Glucose-Capillary: 171 mg/dL — ABNORMAL HIGH (ref 65–99)

## 2016-07-23 MED ORDER — SODIUM CHLORIDE 0.9 % IV BOLUS (SEPSIS)
1000.0000 mL | Freq: Once | INTRAVENOUS | Status: AC
  Administered 2016-07-23: 1000 mL via INTRAVENOUS

## 2016-07-23 NOTE — ED Notes (Signed)
Declined W/C at D/C and was escorted to lobby by RN. 

## 2016-07-23 NOTE — ED Provider Notes (Signed)
[facility]EM[facility]ENCY DEPT Provider Note   CSN: [id number] Arrival date & time: 07/23/16  1251     History   Chief Complaint Chief Complaint  Patient presents with  . Loss of Consciousness    Casey UMA Myers is a 75 y.o. female.  Casey  75 year old female presenting with syncope. At around 10:30 AM the patient finished giving blood. This is the first time she's given blood before. She felt fine. She was at a cookout and while she was standing she felt lightheaded and nauseated. She sat down and felt better. She stood up again and then passed out for up to 30 seconds. Did not fall or hit her head. Never had chest pain or short of breath before or after. No headaches. Does not feel dizzy currently arrest. She states she ate a hotdog about 20 minutes before feeling this way. No abdominal pain. The nausea is better and currently mild. Has not been feeling sick prior to giving blood this morning. No palpitations  Past Medical History:  Diagnosis Date  . Coronary artery disease   . H/O: hysterectomy   . History of palpitations   . History of vertigo   . Hypercholesteremia   . Hyperlipidemia   . Hypertension   . Normocytic anemia   . Syncope    Thought to be related to dehydration in the setting of ongoing antihypertensive use    Patient Active Problem List   Diagnosis Date Noted  . Coronary artery disease 02/04/2014  . Essential hypertension 02/04/2014  . Hyperlipidemia 02/04/2014    Past Surgical History:  Procedure Laterality Date  . APPENDECTOMY    . CHOLECYSTECTOMY    . CORONARY ANGIOPLASTY WITH STENT PLACEMENT      OB History    No data available       Home Medications    Prior to Admission medications   Medication Sig Start Date End Date Taking? Authorizing Provider  aspirin 81 MG chewable tablet Chew 81 mg by mouth daily.    Historical Provider, MD  Cholecalciferol 1000 UNITS tablet Take 1,000 Units by mouth daily.    Historical Provider, MD  cloNIDine  (CATAPRES) 0.2 MG tablet Take 0.2 mg by mouth daily.    Historical Provider, MD  Coenzyme Q10 (CO Q 10) 10 MG CAPS Take by mouth.    Historical Provider, MD  fenofibrate 160 MG tablet Take 160 mg by mouth daily.    Historical Provider, MD  HYDROcodone-acetaminophen (NORCO/VICODIN) 5-325 MG tablet Take 1 tablet by mouth every 6 (six) hours as needed for moderate pain. 04/20/16   Shade Flood, MD  losartan (COZAAR) 100 MG tablet Take 100 mg by mouth daily.    Historical Provider, MD  valACYclovir (VALTREX) 1000 MG tablet Take 1 tablet (1,000 mg total) by mouth 3 (three) times daily. 04/20/16   Shade Flood, MD    Family History Family History  Problem Relation Age of Onset  . Liver cancer Mother     Social History Social History  Substance Use Topics  . Smoking status: Never Smoker  . Smokeless tobacco: Never Used  . Alcohol use No     Allergies   Codeine   Review of Systems Review of Systems  Constitutional: Negative for fever.  Respiratory: Negative for shortness of breath.   Cardiovascular: Negative for chest pain and palpitations.  Gastrointestinal: Positive for nausea. Negative for vomiting.  Neurological: Positive for syncope and light-headedness.  All other systems reviewed and are negative.  Physical Exam Updated Vital Signs BP 137/66   Pulse 77   Temp 98 F (36.7 C) (Oral)   Resp 25   Ht 5\' 6"  (1.676 m)   Wt 134 lb (60.8 kg)   SpO2 99%   BMI 21.63 kg/m   Physical Exam  Constitutional: She is oriented to person, place, and time. She appears well-developed and well-nourished.  HENT:  Head: Normocephalic and atraumatic.  Right Ear: External ear normal.  Left Ear: External ear normal.  Nose: Nose normal.  Mouth/Throat: Oropharynx is clear and moist.  Eyes: EOM are normal. Pupils are equal, round, and reactive to light. Right eye exhibits no discharge. Left eye exhibits no discharge.  Neck: Normal range of motion. Neck supple.  No meningismus    Cardiovascular: Normal rate, regular rhythm and normal heart sounds.   No murmur heard. Pulmonary/Chest: Effort normal and breath sounds normal.  Abdominal: Soft. There is no tenderness.  Neurological: She is alert and oriented to person, place, and time.  CN 3-12 grossly intact. 5/5 strength in all 4 extremities. Grossly normal sensation. Normal finger to nose.   Skin: Skin is warm and dry.  Nursing note and vitals reviewed.    ED Treatments / Results  Labs (all labs ordered are listed, but only abnormal results are displayed) Labs Reviewed  BASIC METABOLIC PANEL - Abnormal; Notable for the following:       Result Value   Potassium 3.4 (*)    Chloride 114 (*)    Glucose, Bld 147 (*)    BUN 23 (*)    Anion gap 4 (*)    All other components within normal limits  CBC - Abnormal; Notable for the following:    RBC 3.56 (*)    Hemoglobin 10.5 (*)    HCT 32.8 (*)    All other components within normal limits  CBG MONITORING, ED - Abnormal; Notable for the following:    Glucose-Capillary 171 (*)    All other components within normal limits    EKG  EKG Interpretation  Date/Time:  Saturday July 23 2016 12:51:39 EDT Ventricular Rate:  78 PR Interval:    QRS Duration: 92 QT Interval:  422 QTC Calculation: 481 R Axis:   76 Text Interpretation:  Sinus rhythm Ventricular premature complex Borderline T wave abnormalities T waves appear flatter compared to 2008 Confirmed by Iktan Aikman MD, Makani Seckman [phone]) on 07/23/2016 1:10:55 PM       Radiology No results found.  Procedures Procedures (including critical care time)  Medications Ordered in ED Medications  sodium chloride 0.9 % bolus 1,000 mL (0 mLs Intravenous Stopped 07/23/16 1552)     Initial Impression / Assessment and Plan / ED Course  I have reviewed the triage vital signs and the nursing notes.  Pertinent labs & imaging results that were available during my care of the patient were reviewed by me and considered in  my medical decision making (see chart for details).  Clinical Course  Comment By Time  Will give bolus and monitor. Likely related to the blood donation and/or hot dog. Re-eval after fluids, labs. Neuro exam benign. No concerning findings on history/exam. Pricilla Loveless, MD 09/16 1347    Feels better after fluids. Labs unremarkable besides mild dehydration which was expected. I highly doubt neuro cause or cardiac cause. Able to get up and walk, no dizziness. D/c with increased oral fluids and f/u with PCP  Final Clinical Impressions(s) / ED Diagnoses   Final diagnoses:  Syncope, unspecified syncope  type    New Prescriptions Discharge Medication List as of 07/23/2016  4:04 PM       Pricilla LovelessScott Nicasio Barlowe, MD 07/23/16 1649

## 2016-07-23 NOTE — ED Triage Notes (Signed)
Pt in from blood drive via [facility] after syncopal episode. Pt gave blood this morning at 1030, felt fine initially, ate and walked. Around 1200, pt felt nauseous, dizzy and weak. Pt was sitting in chair, passed out and remembers waking up in the grass. CBG 178 on EMS arrival, BP 122/68 for EMS. Pt arrives alert, a&ox4, c/o nausea and dizziness.

## 2016-07-23 NOTE — ED Triage Notes (Signed)
PT ambulated in hall with out assist. Pt tol. Well.

## 2016-08-24 ENCOUNTER — Other Ambulatory Visit: Payer: Self-pay | Admitting: Internal Medicine

## 2016-08-24 DIAGNOSIS — E2839 Other primary ovarian failure: Secondary | ICD-10-CM

## 2016-09-05 ENCOUNTER — Ambulatory Visit
Admission: RE | Admit: 2016-09-05 | Discharge: 2016-09-05 | Disposition: A | Discharge status: Home / Self Care | Payer: Medicare Other | Source: Ambulatory Visit | Attending: Internal Medicine | Admitting: Internal Medicine

## 2016-09-05 DIAGNOSIS — E2839 Other primary ovarian failure: Secondary | ICD-10-CM

## 2016-11-09 ENCOUNTER — Other Ambulatory Visit: Payer: Self-pay | Admitting: Internal Medicine

## 2016-11-09 DIAGNOSIS — Z1231 Encounter for screening mammogram for malignant neoplasm of breast: Secondary | ICD-10-CM

## 2016-12-12 ENCOUNTER — Ambulatory Visit
Admission: RE | Admit: 2016-12-12 | Discharge: 2016-12-12 | Disposition: A | Discharge status: Home / Self Care | Payer: Medicare Other | Source: Ambulatory Visit | Attending: Internal Medicine | Admitting: Internal Medicine

## 2016-12-12 DIAGNOSIS — Z1231 Encounter for screening mammogram for malignant neoplasm of breast: Secondary | ICD-10-CM

## 2017-04-28 ENCOUNTER — Telehealth: Payer: Self-pay | Admitting: Cardiovascular Disease

## 2017-04-28 NOTE — Telephone Encounter (Signed)
Pt of Dr. Allyson SabalBerry  Returned call to speak to patient. She reports very occasional twinges of jaw pain (2-3 episodes in last month) persisting for about 30 seconds. Notes they are similar but nowhere near as severe as her symptoms which preceded cath in 2006.  She's also reporting occasional dyspnea for several months.  Notes she is overdue for office visit and called today to get next available - she voiced she is fine to f/u next Wednesday, though a sooner appt was offered. I advised if jaw pain worse/more frequent/persistent preceding appt, to go to ED.  Confirmed appt time and date.  Pt voiced understanding of advice and thanks for call.  Routed to provider as Lorain ChildesFYI

## 2017-04-28 NOTE — Telephone Encounter (Signed)
New message    Has appt on wed 6/26 1020a  Pain in jaw  Pt c/o Shortness Of Breath: STAT if SOB developed within the last 24 hours or pt is noticeably SOB on the phone  1. Are you currently SOB (can you hear that pt is SOB on the phone)?  no  2. How long have you been experiencing SOB?  Several months  3. Are you SOB when sitting or when up moving around? With exertion  4. Are you currently experiencing any other symptoms?  no

## 2017-05-03 ENCOUNTER — Encounter: Payer: Self-pay | Admitting: Cardiovascular Disease

## 2017-05-03 ENCOUNTER — Ambulatory Visit (INDEPENDENT_AMBULATORY_CARE_PROVIDER_SITE_OTHER): Payer: Medicare Other | Admitting: Cardiovascular Disease

## 2017-05-03 VITALS — BP 150/82 | HR 66 | Ht 66.0 in | Wt 134.0 lb

## 2017-05-03 DIAGNOSIS — R6884 Jaw pain: Secondary | ICD-10-CM | POA: Diagnosis not present

## 2017-05-03 DIAGNOSIS — I2583 Coronary atherosclerosis due to lipid rich plaque: Secondary | ICD-10-CM | POA: Diagnosis not present

## 2017-05-03 DIAGNOSIS — I10 Essential (primary) hypertension: Secondary | ICD-10-CM

## 2017-05-03 DIAGNOSIS — E7800 Pure hypercholesterolemia, unspecified: Secondary | ICD-10-CM | POA: Diagnosis not present

## 2017-05-03 DIAGNOSIS — I2510 Atherosclerotic heart disease of native coronary artery without angina pectoris: Secondary | ICD-10-CM

## 2017-05-03 NOTE — Patient Instructions (Signed)
Medication Instructions: Your physician recommends that you continue on your current medications as directed. Please refer to the Current Medication list given to you today.   Testing/Procedures: Your physician has requested that you have an echocardiogram. Echocardiography is a painless test that uses sound waves to create images of your heart. It provides your doctor with information about the size and shape of your heart and how well your heart's chambers and valves are working. This procedure takes approximately one hour. There are no restrictions for this procedure.  Your physician has requested that you have an exercise stress myoview. For further information please visit [email or web address]. Please follow instruction sheet, as given.  Follow-Up: Your physician recommends that you schedule a follow-up appointment with Dr. Allyson SabalBerry after testing.  If you need a refill on your cardiac medications before your next appointment, please call your pharmacy.

## 2017-05-03 NOTE — Assessment & Plan Note (Signed)
History of CAD status post staged RCA and LAD PCI and stenting by myself in 2006. Her symptoms that time or jaw pain. She's had recurrent jaw pain unless several weeks lasting minutes at a time somewhat similar to her pre-stent symptoms. She also has complained of progressive dyspnea on exertion. I am going to get a 2-D echocardiogram and exercise Myoview stress test to further evaluate her symptoms I will see her back after that for further evaluation.

## 2017-05-03 NOTE — Assessment & Plan Note (Signed)
History of essential hypertension blood pressures measured at 150/82. She is on clonidine and losartan. Continue current meds are current dosing

## 2017-05-03 NOTE — Progress Notes (Signed)
05/03/2017 [name]   1941/01/07  [id number]  Primary Physician Andi Devon, MD Primary Cardiologist: Runell Gess MD Roseanne Reno  HPI:   The patient is a 76 year old, thin-appearing, married Caucasian female, mother of 3, grandmother to 3 grandchildren and great-grandmother to 1 great-grandchild whose husband [name] is also a patient of mine. I saw her In the office 01/28/15.  She has a history of CAD status post staged PCI and stenting of her LAD and RCA with drug-eluting stents back in 2006. Her other problems include hypertension and hyperlipidemia. Dr. Renae Gloss follows her lipid profile. She is statin intolerant. Since I saw her over 2 years ago she developed some left jaw and neck pain over the last several weeks. She's had 3 episodes somewhat reminiscent to her symptoms prior to her stent procedure. She also complains of progressive dyspnea on exertion. She does not smoke.   Current Outpatient Prescriptions  Medication Sig Dispense Refill  . aspirin 81 MG chewable tablet Chew 81 mg by mouth daily.    . Cholecalciferol 1000 UNITS tablet Take 1,000 Units by mouth daily.    . cloNIDine (CATAPRES) 0.2 MG tablet Take 0.2 mg by mouth daily.    . Coenzyme Q10 (CO Q 10) 10 MG CAPS Take by mouth.    . fenofibrate 160 MG tablet Take 160 mg by mouth daily.    Marland Kitchen losartan (COZAAR) 100 MG tablet Take 100 mg by mouth daily.     No current facility-administered medications for this visit.     Allergies  Allergen Reactions  . Statins Other (See Comments)  . Codeine Nausea And Vomiting    Social History   Social History  . Marital status: Married    Spouse name: N/A  . Number of children: N/A  . Years of education: N/A   Occupational History  . Not on file.   Social History Main Topics  . Smoking status: Never Smoker  . Smokeless tobacco: Never Used  . Alcohol use No  . Drug use: No  . Sexual activity: Not on file   Other Topics Concern  . Not on  file   Social History Narrative  . No narrative on file     Review of Systems: General: negative for chills, fever, night sweats or weight changes.  Cardiovascular: negative for chest pain, dyspnea on exertion, edema, orthopnea, palpitations, paroxysmal nocturnal dyspnea or shortness of breath Dermatological: negative for rash Respiratory: negative for cough or wheezing Urologic: negative for hematuria Abdominal: negative for nausea, vomiting, diarrhea, bright red blood per rectum, melena, or hematemesis Neurologic: negative for visual changes, syncope, or dizziness All other systems reviewed and are otherwise negative except as noted above.    Blood pressure (!) 150/82, pulse 66, height 5\' 6"  (1.676 m), weight 134 lb (60.8 kg).  General appearance: alert and no distress Neck: no adenopathy, no carotid bruit, no JVD, supple, symmetrical, trachea midline and thyroid not enlarged, symmetric, no tenderness/mass/nodules Lungs: clear to auscultation bilaterally Heart: regular rate and rhythm, S1, S2 normal, no murmur, click, rub or gallop Extremities: extremities normal, atraumatic, no cyanosis or edema  EKG Sinus rhythm at 66 without ST or T-wave changes. I personally reviewed this EKG.  ASSESSMENT AND PLAN:   Coronary artery disease History of CAD status post staged RCA and LAD PCI and stenting by myself in 2006. Her symptoms that time or jaw pain. She's had recurrent jaw pain unless several weeks lasting minutes at a time somewhat  similar to her pre-stent symptoms. She also has complained of progressive dyspnea on exertion. I am going to get a 2-D echocardiogram and exercise Myoview stress test to further evaluate her symptoms I will see her back after that for further evaluation.  Essential hypertension History of essential hypertension blood pressures measured at 150/82. She is on clonidine and losartan. Continue current meds are current dosing  Hyperlipidemia History of  hyperlipidemia on Lovaza intolerant to statin therapy.      Runell GessJonathan J. Brennley Curtice MD FACP,FACC,FAHA, [facility] 05/03/2017 10:31 AM

## 2017-05-03 NOTE — Assessment & Plan Note (Signed)
History of hyperlipidemia on Lovaza intolerant to statin therapy.

## 2017-05-11 ENCOUNTER — Telehealth (HOSPITAL_COMMUNITY): Payer: Self-pay

## 2017-05-11 NOTE — Telephone Encounter (Signed)
Encounter complete. 

## 2017-05-12 ENCOUNTER — Telehealth (HOSPITAL_COMMUNITY): Payer: Self-pay

## 2017-05-12 NOTE — Telephone Encounter (Signed)
Encounter complete. 

## 2017-05-15 ENCOUNTER — Ambulatory Visit (HOSPITAL_BASED_OUTPATIENT_CLINIC_OR_DEPARTMENT_OTHER): Payer: Medicare Other

## 2017-05-15 ENCOUNTER — Other Ambulatory Visit (HOSPITAL_COMMUNITY): Payer: Medicare Other

## 2017-05-15 ENCOUNTER — Other Ambulatory Visit: Payer: Self-pay

## 2017-05-15 DIAGNOSIS — I2583 Coronary atherosclerosis due to lipid rich plaque: Secondary | ICD-10-CM

## 2017-05-15 DIAGNOSIS — I2510 Atherosclerotic heart disease of native coronary artery without angina pectoris: Secondary | ICD-10-CM | POA: Diagnosis not present

## 2017-05-15 DIAGNOSIS — I10 Essential (primary) hypertension: Secondary | ICD-10-CM

## 2017-05-15 DIAGNOSIS — I081 Rheumatic disorders of both mitral and tricuspid valves: Secondary | ICD-10-CM | POA: Diagnosis not present

## 2017-05-15 DIAGNOSIS — E785 Hyperlipidemia, unspecified: Secondary | ICD-10-CM | POA: Diagnosis not present

## 2017-05-15 DIAGNOSIS — R6884 Jaw pain: Secondary | ICD-10-CM

## 2017-05-15 DIAGNOSIS — Z9861 Coronary angioplasty status: Secondary | ICD-10-CM | POA: Diagnosis not present

## 2017-05-16 ENCOUNTER — Ambulatory Visit (HOSPITAL_COMMUNITY)
Admission: RE | Admit: 2017-05-16 | Discharge: 2017-05-16 | Disposition: A | Discharge status: Home / Self Care | Payer: Medicare Other | Source: Ambulatory Visit | Attending: Cardiovascular Disease | Admitting: Cardiovascular Disease

## 2017-05-16 DIAGNOSIS — I10 Essential (primary) hypertension: Secondary | ICD-10-CM

## 2017-05-16 DIAGNOSIS — R6884 Jaw pain: Secondary | ICD-10-CM | POA: Diagnosis not present

## 2017-05-16 DIAGNOSIS — I2583 Coronary atherosclerosis due to lipid rich plaque: Secondary | ICD-10-CM | POA: Diagnosis not present

## 2017-05-16 DIAGNOSIS — I2510 Atherosclerotic heart disease of native coronary artery without angina pectoris: Secondary | ICD-10-CM | POA: Diagnosis not present

## 2017-05-16 DIAGNOSIS — E785 Hyperlipidemia, unspecified: Secondary | ICD-10-CM | POA: Insufficient documentation

## 2017-05-16 DIAGNOSIS — Z9861 Coronary angioplasty status: Secondary | ICD-10-CM | POA: Insufficient documentation

## 2017-05-16 DIAGNOSIS — I081 Rheumatic disorders of both mitral and tricuspid valves: Secondary | ICD-10-CM | POA: Insufficient documentation

## 2017-05-16 LAB — MYOCARDIAL PERFUSION IMAGING
CHL CUP NUCLEAR SRS: 2
LVDIAVOL: 65 mL (ref 46–106)
LVSYSVOL: 21 mL
Peak HR: 86 {beats}/min
Rest HR: 55 {beats}/min
SDS: 1
SSS: 3
TID: 1.1

## 2017-05-16 MED ORDER — TECHNETIUM TC 99M TETROFOSMIN IV KIT
10.2000 | PACK | Freq: Once | INTRAVENOUS | Status: AC | PRN
Start: 2017-05-16 — End: 2017-05-16
  Administered 2017-05-16: 10.2 via INTRAVENOUS
  Filled 2017-05-16: qty 11

## 2017-05-16 MED ORDER — REGADENOSON 0.4 MG/5ML IV SOLN
0.4000 mg | Freq: Once | INTRAVENOUS | Status: AC
  Administered 2017-05-16: 0.4 mg via INTRAVENOUS

## 2017-05-16 MED ORDER — TECHNETIUM TC 99M TETROFOSMIN IV KIT
29.4000 | PACK | Freq: Once | INTRAVENOUS | Status: AC | PRN
  Administered 2017-05-16: 29.4 via INTRAVENOUS
  Filled 2017-05-16: qty 30

## 2017-05-31 ENCOUNTER — Ambulatory Visit: Payer: Medicare Other | Admitting: Cardiovascular Disease

## 2017-06-01 ENCOUNTER — Other Ambulatory Visit: Payer: Self-pay | Admitting: Cardiovascular Disease

## 2017-06-01 DIAGNOSIS — I2510 Atherosclerotic heart disease of native coronary artery without angina pectoris: Secondary | ICD-10-CM

## 2017-10-09 ENCOUNTER — Telehealth: Payer: Self-pay | Admitting: Cardiovascular Disease

## 2017-10-09 NOTE — Telephone Encounter (Signed)
Called patient and LVM to call back to scheduled cholesterol appointment per Dr. Allyson SabalBerry.

## 2017-12-13 ENCOUNTER — Other Ambulatory Visit: Payer: Self-pay | Admitting: Internal Medicine

## 2017-12-13 DIAGNOSIS — Z1231 Encounter for screening mammogram for malignant neoplasm of breast: Secondary | ICD-10-CM

## 2018-01-02 ENCOUNTER — Ambulatory Visit
Admission: RE | Admit: 2018-01-02 | Discharge: 2018-01-02 | Disposition: A | Discharge status: Home / Self Care | Payer: Medicare Other | Source: Ambulatory Visit | Attending: Internal Medicine | Admitting: Internal Medicine

## 2018-01-02 DIAGNOSIS — Z1231 Encounter for screening mammogram for malignant neoplasm of breast: Secondary | ICD-10-CM

## 2018-01-12 ENCOUNTER — Encounter: Payer: Medicare Other | Admitting: Family Medicine

## 2018-02-04 NOTE — Progress Notes (Deleted)
Subjective:    Patient ID: [name], female    DOAdele BarthelB: 12/18/1940, 77 y.o.   MRN: [id number]  01/12/2018  No chief complaint on file.    HPI This 77 y.o. female presents for evaluation of ***. BP Readings from Last 3 Encounters:  05/03/17 (!) 150/82  07/23/16 137/66  04/20/16 (!) 160/82   Wt Readings from Last 3 Encounters:  05/16/17 134 lb (60.8 kg)  05/03/17 134 lb (60.8 kg)  07/23/16 134 lb (60.8 kg)    There is no immunization history on file for this patient.  Review of Systems  Past Medical History:  Diagnosis Date  . Coronary artery disease   . H/O: hysterectomy   . History of palpitations   . History of vertigo   . Hypercholesteremia   . Hyperlipidemia   . Hypertension   . Normocytic anemia   . Syncope    Thought to be related to dehydration in the setting of ongoing antihypertensive use   Past Surgical History:  Procedure Laterality Date  . APPENDECTOMY    . CHOLECYSTECTOMY    . CORONARY ANGIOPLASTY WITH STENT PLACEMENT     Allergies  Allergen Reactions  . Statins Other (See Comments)  . Codeine Nausea And Vomiting   Current Outpatient Medications on File Prior to Visit  Medication Sig Dispense Refill  . aspirin 81 MG chewable tablet Chew 81 mg by mouth daily.    . Cholecalciferol 1000 UNITS tablet Take 1,000 Units by mouth daily.    . cloNIDine (CATAPRES) 0.2 MG tablet Take 0.2 mg by mouth daily.    . Coenzyme Q10 (CO Q 10) 10 MG CAPS Take by mouth.    . fenofibrate 160 MG tablet Take 160 mg by mouth daily.    Marland Kitchen. losartan (COZAAR) 100 MG tablet Take 100 mg by mouth daily.     No current facility-administered medications on file prior to visit.    Social History   Socioeconomic History  . Marital status: Married    Spouse name: Not on file  . Number of children: Not on file  . Years of education: Not on file  . Highest education level: Not on file  Occupational History  . Not on file  Social Needs  . Financial resource strain: Not on  file  . Food insecurity:    Worry: Not on file    Inability: Not on file  . Transportation needs:    Medical: Not on file    Non-medical: Not on file  Tobacco Use  . Smoking status: Never Smoker  . Smokeless tobacco: Never Used  Substance and Sexual Activity  . Alcohol use: No  . Drug use: No  . Sexual activity: Not on file  Lifestyle  . Physical activity:    Days per week: Not on file    Minutes per session: Not on file  . Stress: Not on file  Relationships  . Social connections:    Talks on phone: Not on file    Gets together: Not on file    Attends religious service: Not on file    Active member of club or organization: Not on file    Attends meetings of clubs or organizations: Not on file    Relationship status: Not on file  . Intimate partner violence:    Fear of current or ex partner: Not on file    Emotionally abused: Not on file    Physically abused: Not on file    Forced sexual  activity: Not on file  Other Topics Concern  . Not on file  Social History Narrative  . Not on file   Family History  Problem Relation Age of Onset  . Liver cancer Mother        Objective:    There were no vitals taken for this visit. Physical Exam No results found. Depression screen PHQ 2/9 04/20/2016  Decreased Interest 0  Down, Depressed, Hopeless 0  PHQ - 2 Score 0   Fall Risk  04/20/2016  Falls in the past year? No        Assessment & Plan:  No diagnosis found.  ***  No orders of the defined types were placed in this encounter.  No orders of the defined types were placed in this encounter.   No follow-ups on file.   Brendaly Townsel Paulita Fujita, M.D. Primary Care at [facility] previously [facility] [address] [place], [place]  [address] [phone] phone [phone] fax

## 2018-04-04 ENCOUNTER — Encounter: Payer: Self-pay | Admitting: Family Medicine

## 2018-05-21 ENCOUNTER — Ambulatory Visit (HOSPITAL_COMMUNITY): Payer: Medicare Other | Attending: Cardiovascular Disease

## 2018-05-21 ENCOUNTER — Other Ambulatory Visit: Payer: Self-pay

## 2018-05-21 DIAGNOSIS — E785 Hyperlipidemia, unspecified: Secondary | ICD-10-CM | POA: Insufficient documentation

## 2018-05-21 DIAGNOSIS — I083 Combined rheumatic disorders of mitral, aortic and tricuspid valves: Secondary | ICD-10-CM | POA: Diagnosis not present

## 2018-05-21 DIAGNOSIS — I509 Heart failure, unspecified: Secondary | ICD-10-CM | POA: Insufficient documentation

## 2018-05-21 DIAGNOSIS — I110 Hypertensive heart disease with heart failure: Secondary | ICD-10-CM | POA: Insufficient documentation

## 2018-05-21 DIAGNOSIS — I2510 Atherosclerotic heart disease of native coronary artery without angina pectoris: Secondary | ICD-10-CM | POA: Diagnosis not present

## 2018-06-08 NOTE — Progress Notes (Signed)
This encounter was created in error - please disregard.

## 2018-12-28 ENCOUNTER — Other Ambulatory Visit: Payer: Self-pay | Admitting: Internal Medicine

## 2018-12-28 DIAGNOSIS — Z1231 Encounter for screening mammogram for malignant neoplasm of breast: Secondary | ICD-10-CM

## 2019-01-24 ENCOUNTER — Inpatient Hospital Stay: Admission: RE | Admit: 2019-01-24 | Payer: Medicare Other | Source: Ambulatory Visit

## 2019-04-09 ENCOUNTER — Other Ambulatory Visit: Payer: Self-pay

## 2019-04-09 ENCOUNTER — Ambulatory Visit
Admission: RE | Admit: 2019-04-09 | Discharge: 2019-04-09 | Disposition: A | Discharge status: Home / Self Care | Payer: Medicare Other | Source: Ambulatory Visit | Attending: Internal Medicine | Admitting: Internal Medicine

## 2019-04-09 DIAGNOSIS — Z1231 Encounter for screening mammogram for malignant neoplasm of breast: Secondary | ICD-10-CM

## 2019-04-11 ENCOUNTER — Ambulatory Visit
Admission: RE | Admit: 2019-04-11 | Discharge: 2019-04-11 | Disposition: A | Discharge status: Home / Self Care | Payer: Medicare Other | Source: Ambulatory Visit | Attending: Internal Medicine | Admitting: Internal Medicine

## 2019-04-11 ENCOUNTER — Other Ambulatory Visit: Payer: Self-pay | Admitting: Internal Medicine

## 2019-04-11 ENCOUNTER — Other Ambulatory Visit: Payer: Self-pay

## 2019-04-11 DIAGNOSIS — M25552 Pain in left hip: Secondary | ICD-10-CM

## 2020-03-11 ENCOUNTER — Other Ambulatory Visit: Payer: Self-pay | Admitting: Internal Medicine

## 2020-03-11 DIAGNOSIS — Z1231 Encounter for screening mammogram for malignant neoplasm of breast: Secondary | ICD-10-CM

## 2020-04-09 ENCOUNTER — Ambulatory Visit
Admission: RE | Admit: 2020-04-09 | Discharge: 2020-04-09 | Disposition: A | Discharge status: Home / Self Care | Payer: Medicare Other | Source: Ambulatory Visit | Attending: Internal Medicine | Admitting: Internal Medicine

## 2020-04-09 ENCOUNTER — Other Ambulatory Visit: Payer: Self-pay

## 2020-04-09 ENCOUNTER — Ambulatory Visit: Payer: Medicare Other

## 2020-04-09 DIAGNOSIS — Z1231 Encounter for screening mammogram for malignant neoplasm of breast: Secondary | ICD-10-CM

## 2020-05-08 ENCOUNTER — Encounter: Payer: Self-pay | Admitting: Neurology

## 2020-06-23 ENCOUNTER — Encounter: Payer: Self-pay | Admitting: Cardiovascular Disease

## 2020-06-23 ENCOUNTER — Other Ambulatory Visit: Payer: Self-pay

## 2020-06-23 ENCOUNTER — Ambulatory Visit: Payer: Medicare Other | Admitting: Cardiovascular Disease

## 2020-06-23 VITALS — BP 164/82 | HR 61 | Ht 65.5 in | Wt 127.8 lb

## 2020-06-23 DIAGNOSIS — E7800 Pure hypercholesterolemia, unspecified: Secondary | ICD-10-CM | POA: Diagnosis not present

## 2020-06-23 DIAGNOSIS — I739 Peripheral vascular disease, unspecified: Secondary | ICD-10-CM | POA: Diagnosis not present

## 2020-06-23 DIAGNOSIS — I2510 Atherosclerotic heart disease of native coronary artery without angina pectoris: Secondary | ICD-10-CM

## 2020-06-23 DIAGNOSIS — I10 Essential (primary) hypertension: Secondary | ICD-10-CM | POA: Diagnosis not present

## 2020-06-23 NOTE — Progress Notes (Signed)
06/23/2020 [name]   12-05-1940  [id number]  Primary Physician Andi Devon, MD Primary Cardiologist: Runell Gess MD Milagros Loll, [place], [place]  HPI:  [name] is a 79 y.o.  thin-appearing, married Caucasian female, mother of 3, grandmother to 3 grandchildren and great-grandmother to 1 great-grandchild whose husband [name] is also a patient of mine. I saw her In the office 05/03/2017.  She has a history of CAD status Myers staged PCI and stenting of her LAD and RCA with drug-eluting stents back in 2006. Her other problems include hypertension and hyperlipidemia. Dr. Renae Gloss follows her lipid profile. She is statin intolerant.  When I saw her 3 years ago she was complaining of some atypical left jaw and neck pain as well as some progressive dyspnea.  A 2D echo performed 05/21/2018 was essentially normal with grade 2 diastolic dysfunction and a Myoview stress test performed 05/16/2017 was nonischemic.  She has had no recurrent symptoms.  Since I saw her 3 years ago she is done well.  She denies chest pain or shortness of breath.  She has complained of some atypical burning sensation of her right anterior shin on ambulation occasionally.  This does not sound like claudication.  Current Meds  Medication Sig  . Cholecalciferol 1000 UNITS tablet Take 1,000 Units by mouth daily.  . cloNIDine (CATAPRES) 0.2 MG tablet Take 0.2 mg by mouth daily.  . Coenzyme Q10 (CO Q 10) 10 MG CAPS Take by mouth.  . meloxicam (MOBIC) 15 MG tablet meloxicam 15 mg tablet  Take 1 tab po daily     Allergies  Allergen Reactions  . Statins Other (See Comments)  . Codeine Nausea And Vomiting    Social History   Socioeconomic History  . Marital status: Married    Spouse name: Not on file  . Number of children: Not on file  . Years of education: Not on file  . Highest education level: Not on file  Occupational History  . Not on file  Tobacco Use  . Smoking status: Never Smoker  .  Smokeless tobacco: Never Used  Substance and Sexual Activity  . Alcohol use: No  . Drug use: No  . Sexual activity: Not on file  Other Topics Concern  . Not on file  Social History Narrative  . Not on file   Social Determinants of Health   Financial Resource Strain:   . Difficulty of Paying Living Expenses:   Food Insecurity:   . Worried About Programme researcher, broadcasting/film/video in the Last Year:   . Barista in the Last Year:   Transportation Needs:   . Freight forwarder (Medical):   Marland Kitchen Lack of Transportation (Non-Medical):   Physical Activity:   . Days of Exercise per Week:   . Minutes of Exercise per Session:   Stress:   . Feeling of Stress :   Social Connections:   . Frequency of Communication with Friends and Family:   . Frequency of Social Gatherings with Friends and Family:   . Attends Religious Services:   . Active Member of Clubs or Organizations:   . Attends Banker Meetings:   Marland Kitchen Marital Status:   Intimate Partner Violence:   . Fear of Current or Ex-Partner:   . Emotionally Abused:   Marland Kitchen Physically Abused:   . Sexually Abused:      Review of Systems: General: negative for chills, fever, night sweats or weight changes.  Cardiovascular: negative  for chest pain, dyspnea on exertion, edema, orthopnea, palpitations, paroxysmal nocturnal dyspnea or shortness of breath Dermatological: negative for rash Respiratory: negative for cough or wheezing Urologic: negative for hematuria Abdominal: negative for nausea, vomiting, diarrhea, bright red blood per rectum, melena, or hematemesis Neurologic: negative for visual changes, syncope, or dizziness All other systems reviewed and are otherwise negative except as noted above.    Blood pressure (!) 164/82, pulse 61, height 5' 5.5" (1.664 m), weight 127 lb 12.8 oz (58 kg), SpO2 97 %.  General appearance: alert and no distress Neck: no adenopathy, no carotid bruit, no JVD, supple, symmetrical, trachea midline and  thyroid not enlarged, symmetric, no tenderness/mass/nodules Lungs: clear to auscultation bilaterally Heart: regular rate and rhythm, S1, S2 normal, no murmur, click, rub or gallop Extremities: extremities normal, atraumatic, no cyanosis or edema Pulses: 2+ and symmetric Skin: Skin color, texture, turgor normal. No rashes or lesions Neurologic: Alert and oriented X 3, normal strength and tone. Normal symmetric reflexes. Normal coordination and gait  EKG sinus rhythm at 61 without ST or T wave changes.  I personally reviewed this EKG.  ASSESSMENT AND PLAN:   Coronary artery disease History of CAD status Myers staged PCI and drug-eluting stenting of her LAD and RCA by myself in 2006.  She is currently asymptomatic.  Essential hypertension History of essential hypertension blood pressure measured today 164/82.  She is on Catapres patch.  Hyperlipidemia History of hyperlipidemia intolerant to statin therapy followed by her PCP  Peripheral arterial disease (HCC) Ms. Langer was referred to me by Dr. Heinz Knuckles , her podiatrist, for atypical "electrical type pain" of her right anterior shin on occasion when walking.  This is not similar claudication.  She has 2+ pedal pulses.  I am going to check ABIs however.      Runell Gess MD FACP,FACC,FAHA, [facility] 06/23/2020 8:23 AM

## 2020-06-23 NOTE — Assessment & Plan Note (Signed)
Ms. [name] was referred to me by Dr. Heinz Knuckles , her podiatrist, for atypical "electrical type pain" of her right anterior shin on occasion when walking.  This is not similar claudication.  She has 2+ pedal pulses.  I am going to check ABIs however.

## 2020-06-23 NOTE — Assessment & Plan Note (Signed)
History of CAD status post staged PCI and drug-eluting stenting of her LAD and RCA by myself in 2006.  She is currently asymptomatic.

## 2020-06-23 NOTE — Assessment & Plan Note (Signed)
History of hyperlipidemia intolerant to statin therapy followed by her PCP 

## 2020-06-23 NOTE — Patient Instructions (Signed)
Medication Instructions:  The current medical regimen is effective;  continue present plan and medications.  *If you need a refill on your cardiac medications before your next appointment, please call your pharmacy*   Lab Work: We will request blood work from your primary care office.   If you have labs (blood work) drawn today and your tests are completely normal, you will receive your results only by: Marland Kitchen MyChart Message (if you have MyChart) OR . A paper copy in the mail If you have any lab test that is abnormal or we need to change your treatment, we will call you to review the results.   Testing/Procedures: Your physician has requested that you have an ankle brachial index (ABI). During this test an ultrasound and blood pressure cuff are used to evaluate the arteries that supply the arms and legs with blood. Allow thirty minutes for this exam. There are no restrictions or special instructions.   Follow-Up: At [facility], you and your health needs are our priority.  As part of our continuing mission to provide you with exceptional heart care, we have created designated Provider Care Teams.  These Care Teams include your primary Cardiologist (physician) and Advanced Practice Providers (APPs -  Physician Assistants and Nurse Practitioners) who all work together to provide you with the care you need, when you need it.  We recommend signing up for the patient portal called "MyChart".  Sign up information is provided on this After Visit Summary.  MyChart is used to connect with patients for Virtual Visits (Telemedicine).  Patients are able to view lab/test results, encounter notes, upcoming appointments, etc.  Non-urgent messages can be sent to your provider as well.   To learn more about what you can do with MyChart, go to [email or web address].    Your next appointment:   12 month(s)  The format for your next appointment:   In Person  Provider:   Nanetta Batty, MD

## 2020-06-23 NOTE — Assessment & Plan Note (Signed)
History of essential hypertension blood pressure measured today 164/82.  She is on Catapres patch.

## 2020-06-30 ENCOUNTER — Other Ambulatory Visit: Payer: Self-pay | Admitting: Cardiovascular Disease

## 2020-06-30 DIAGNOSIS — I739 Peripheral vascular disease, unspecified: Secondary | ICD-10-CM

## 2020-07-17 ENCOUNTER — Ambulatory Visit (HOSPITAL_COMMUNITY)
Admission: RE | Admit: 2020-07-17 | Discharge: 2020-07-17 | Disposition: A | Discharge status: Home / Self Care | Payer: Medicare Other | Source: Ambulatory Visit | Attending: Cardiology | Admitting: Cardiology

## 2020-07-17 ENCOUNTER — Other Ambulatory Visit: Payer: Self-pay

## 2020-07-17 DIAGNOSIS — I739 Peripheral vascular disease, unspecified: Secondary | ICD-10-CM | POA: Diagnosis present

## 2020-09-28 ENCOUNTER — Other Ambulatory Visit: Payer: Self-pay

## 2020-09-28 ENCOUNTER — Ambulatory Visit
Admission: RE | Admit: 2020-09-28 | Discharge: 2020-09-28 | Disposition: A | Discharge status: Home / Self Care | Payer: Medicare Other | Source: Ambulatory Visit | Attending: Internal Medicine | Admitting: Internal Medicine

## 2020-09-28 ENCOUNTER — Other Ambulatory Visit: Payer: Self-pay | Admitting: Internal Medicine

## 2020-09-28 DIAGNOSIS — R109 Unspecified abdominal pain: Secondary | ICD-10-CM

## 2021-03-02 ENCOUNTER — Other Ambulatory Visit: Payer: Self-pay | Admitting: Internal Medicine

## 2021-03-02 DIAGNOSIS — Z1231 Encounter for screening mammogram for malignant neoplasm of breast: Secondary | ICD-10-CM

## 2021-04-22 ENCOUNTER — Ambulatory Visit
Admission: RE | Admit: 2021-04-22 | Discharge: 2021-04-22 | Disposition: A | Discharge status: Home / Self Care | Payer: Medicare Other | Source: Ambulatory Visit | Attending: Internal Medicine | Admitting: Internal Medicine

## 2021-04-22 ENCOUNTER — Other Ambulatory Visit: Payer: Self-pay

## 2021-04-22 DIAGNOSIS — Z1231 Encounter for screening mammogram for malignant neoplasm of breast: Secondary | ICD-10-CM

## 2021-07-06 ENCOUNTER — Other Ambulatory Visit: Payer: Self-pay

## 2021-07-06 ENCOUNTER — Ambulatory Visit: Payer: Medicare Other | Admitting: Cardiovascular Disease

## 2021-07-06 ENCOUNTER — Encounter: Payer: Self-pay | Admitting: Cardiovascular Disease

## 2021-07-06 VITALS — BP 140/77 | HR 66 | Ht 66.0 in | Wt 127.2 lb

## 2021-07-06 DIAGNOSIS — I10 Essential (primary) hypertension: Secondary | ICD-10-CM

## 2021-07-06 DIAGNOSIS — I739 Peripheral vascular disease, unspecified: Secondary | ICD-10-CM

## 2021-07-06 DIAGNOSIS — E7800 Pure hypercholesterolemia, unspecified: Secondary | ICD-10-CM

## 2021-07-06 NOTE — Patient Instructions (Signed)
Medication Instructions:  The current medical regimen is effective;  continue present plan and medications.  *If you need a refill on your cardiac medications before your next appointment, please call your pharmacy*   Lab Work: LIPID/LIVER today  If you have labs (blood work) drawn today and your tests are completely normal, you will receive your results only by: . MyChart Message (if you have MyChart) OR . A paper copy in the mail If you have any lab test that is abnormal or we need to change your treatment, we will call you to review the results.   Follow-Up: At [facility], you and your health needs are our priority.  As part of our continuing mission to provide you with exceptional heart care, we have created designated Provider Care Teams.  These Care Teams include your primary Cardiologist (physician) and Advanced Practice Providers (APPs -  Physician Assistants and Nurse Practitioners) who all work together to provide you with the care you need, when you need it.  We recommend signing up for the patient portal called "MyChart".  Sign up information is provided on this After Visit Summary.  MyChart is used to connect with patients for Virtual Visits (Telemedicine).  Patients are able to view lab/test results, encounter notes, upcoming appointments, etc.  Non-urgent messages can be sent to your provider as well.   To learn more about what you can do with MyChart, go to [email or web address].    Your next appointment:   12 month(s)  The format for your next appointment:   In Person  Provider:   Jonathan Berry, MD      

## 2021-07-06 NOTE — Assessment & Plan Note (Signed)
Ms. Jerkins complains of some right anterior shin discomfort in the past.  We could did get segmental pressures on her 07/17/2020 that revealed normal ABIs with triphasic waveforms.

## 2021-07-06 NOTE — Assessment & Plan Note (Signed)
History of CAD status post PCI and stenting of her LAD and RCA in a staged fashion drug-eluting stents back in 2006.  She did have a negative Myoview stress test performed 05/16/2017.  She denies chest pain but does complain of some dyspnea on exertion which she had several years ago when an echo revealed normal LV systolic function with grade 2 diastolic dysfunction.

## 2021-07-06 NOTE — Assessment & Plan Note (Signed)
History of hyperlipidemia on Zetia.  We will check a lipid liver profile today.

## 2021-07-06 NOTE — Progress Notes (Signed)
07/06/2021 [name]   [birth year]  [id number]  Primary Physician Andi Devon, MD Primary Cardiologist: Runell Gess MD Milagros Loll, [place], [place]  HPI:  [name] is a 80 y.o.  hin-appearing, married Caucasian female, mother of 3, grandmother to 3 grandchildren and great-grandmother to 1 great-grandchild whose husband [name] is also a patient of mine. I saw her In the office 06/23/2020.  She has a history of CAD status post staged PCI and stenting of her LAD and RCA with drug-eluting stents back in 2006. Her other problems include hypertension and hyperlipidemia. Dr. Renae Gloss follows her lipid profile. She is statin intolerant.  When I saw her 3 years ago she was complaining of some atypical left jaw and neck pain as well as some progressive dyspnea.  A 2D echo performed 05/21/2018 was essentially normal with grade 2 diastolic dysfunction and a Myoview stress test performed 05/16/2017 was nonischemic.  She has had no recurrent symptoms.   Since I saw her in the office a year ago she continues to do well.  She does still complain of some dyspnea on exertion, similar symptoms that she had several years ago with a negative work-up.  I did do lower extremity arterial Doppler studies on her 07/17/2020 that showed normal ABIs bilaterally with triphasic waveforms.   Current Meds  Medication Sig   amLODipine (NORVASC) 5 MG tablet amlodipine 5 mg tablet  TAKE 1 TABLET BY MOUTH EVERY DAY   Cholecalciferol 1000 UNITS tablet Take 1,000 Units by mouth daily.   cloNIDine (CATAPRES) 0.2 MG tablet Take 0.2 mg by mouth daily.   Coenzyme Q10 (CO Q 10) 10 MG CAPS Take by mouth.   ezetimibe (ZETIA) 10 MG tablet Take 10 mg by mouth daily.   meloxicam (MOBIC) 15 MG tablet meloxicam 15 mg tablet  Take 1 tab po daily   valsartan (DIOVAN) 160 MG tablet valsartan 160 mg tablet  TAKE 1 TABLET BY MOUTH EVERY DAY     Allergies  Allergen Reactions   Statins Other (See Comments)   Codeine  Nausea And Vomiting    Social History   Socioeconomic History   Marital status: Married    Spouse name: Not on file   Number of children: Not on file   Years of education: Not on file   Highest education level: Not on file  Occupational History   Not on file  Tobacco Use   Smoking status: Never   Smokeless tobacco: Never  Substance and Sexual Activity   Alcohol use: No   Drug use: No   Sexual activity: Not on file  Other Topics Concern   Not on file  Social History Narrative   Not on file   Social Determinants of Health   Financial Resource Strain: Not on file  Food Insecurity: Not on file  Transportation Needs: Not on file  Physical Activity: Not on file  Stress: Not on file  Social Connections: Not on file  Intimate Partner Violence: Not on file     Review of Systems: General: negative for chills, fever, night sweats or weight changes.  Cardiovascular: negative for chest pain, dyspnea on exertion, edema, orthopnea, palpitations, paroxysmal nocturnal dyspnea or shortness of breath Dermatological: negative for rash Respiratory: negative for cough or wheezing Urologic: negative for hematuria Abdominal: negative for nausea, vomiting, diarrhea, bright red blood per rectum, melena, or hematemesis Neurologic: negative for visual changes, syncope, or dizziness All other systems reviewed and are otherwise negative except as noted  above.    Blood pressure 140/77, pulse 66, height 5\' 6"  (1.676 m), weight 127 lb 3.2 oz (57.7 kg), SpO2 94 %.  General appearance: alert and no distress Neck: no adenopathy, no carotid bruit, no JVD, supple, symmetrical, trachea midline, and thyroid not enlarged, symmetric, no tenderness/mass/nodules Lungs: clear to auscultation bilaterally Heart: regular rate and rhythm, S1, S2 normal, no murmur, click, rub or gallop Extremities: extremities normal, atraumatic, no cyanosis or edema Pulses: 2+ and symmetric Skin: Skin color, texture, turgor  normal. No rashes or lesions Neurologic: Grossly normal  EKG sinus rhythm at 66 with nonspecific ST and T wave changes.  I personally reviewed this EKG.  ASSESSMENT AND PLAN:   Coronary artery disease History of CAD status post PCI and stenting of her LAD and RCA in a staged fashion drug-eluting stents back in 2006.  She did have a negative Myoview stress test performed 05/16/2017.  She denies chest pain but does complain of some dyspnea on exertion which she had several years ago when an echo revealed normal LV systolic function with grade 2 diastolic dysfunction.  Essential hypertension History of essential hypertension a blood pressure measured today 140/77.  She says at home it is much lower than this.  She is on amlodipine, clonidine and valsartan.  Hyperlipidemia History of hyperlipidemia on Zetia.  We will check a lipid liver profile today.  Peripheral arterial disease (HCC) Ms. Gonder complains of some right anterior shin discomfort in the past.  We could did get segmental pressures on her 07/17/2020 that revealed normal ABIs with triphasic waveforms.     09/16/2020 MD FACP,FACC,FAHA, [facility] 07/06/2021 11:50 AM

## 2021-07-06 NOTE — Assessment & Plan Note (Signed)
History of essential hypertension a blood pressure measured today 140/77.  She says at home it is much lower than this.  She is on amlodipine, clonidine and valsartan.

## 2021-07-07 LAB — LIPID PANEL
Chol/HDL Ratio: 3.6 ratio (ref 0.0–4.4)
Cholesterol, Total: 208 mg/dL — ABNORMAL HIGH (ref 100–199)
HDL: 58 mg/dL (ref >39)
LDL Chol Calc (NIH): 125 mg/dL — ABNORMAL HIGH (ref 0–99)
Triglycerides: 143 mg/dL (ref 0–149)
VLDL Cholesterol Cal: 25 mg/dL (ref 5–40)

## 2021-07-07 LAB — HEPATIC FUNCTION PANEL
ALT: 15 IU/L (ref 0–32)
AST: 16 IU/L (ref 0–40)
Albumin: 4.3 g/dL (ref 3.7–4.7)
Alkaline Phosphatase: 75 IU/L (ref 44–121)
Bilirubin Total: 0.4 mg/dL (ref 0.0–1.2)
Bilirubin, Direct: 0.11 mg/dL (ref 0.00–0.40)
Total Protein: 6.4 g/dL (ref 6.0–8.5)

## 2021-07-15 ENCOUNTER — Other Ambulatory Visit (HOSPITAL_BASED_OUTPATIENT_CLINIC_OR_DEPARTMENT_OTHER): Payer: Self-pay

## 2021-07-15 DIAGNOSIS — E7800 Pure hypercholesterolemia, unspecified: Secondary | ICD-10-CM

## 2021-09-28 ENCOUNTER — Ambulatory Visit: Payer: Medicare Other | Admitting: Internal Medicine

## 2021-09-28 ENCOUNTER — Other Ambulatory Visit: Payer: Self-pay

## 2021-09-28 ENCOUNTER — Encounter: Payer: Self-pay | Admitting: Internal Medicine

## 2021-09-28 VITALS — BP 132/60 | HR 56 | Ht 66.0 in | Wt 127.4 lb

## 2021-09-28 DIAGNOSIS — I10 Essential (primary) hypertension: Secondary | ICD-10-CM | POA: Diagnosis not present

## 2021-09-28 DIAGNOSIS — M791 Myalgia, unspecified site: Secondary | ICD-10-CM

## 2021-09-28 DIAGNOSIS — E785 Hyperlipidemia, unspecified: Secondary | ICD-10-CM | POA: Diagnosis not present

## 2021-09-28 DIAGNOSIS — I2510 Atherosclerotic heart disease of native coronary artery without angina pectoris: Secondary | ICD-10-CM | POA: Diagnosis not present

## 2021-09-28 DIAGNOSIS — T466X5A Adverse effect of antihyperlipidemic and antiarteriosclerotic drugs, initial encounter: Secondary | ICD-10-CM

## 2021-09-28 MED ORDER — NEXLETOL 180 MG PO TABS: 1.0000 | ORAL_TABLET | Freq: Every day | ORAL | 11 refills | Status: DC

## 2021-09-28 NOTE — Patient Instructions (Signed)
Medication Instructions:  START Nexletol 180mg  daily  The [employer] foundation offers assistance to help pay for medication copays.  They will cover copays for all cholesterol lowering meds, including statins, fibrates, omega-3 fish oils like Vascepa, ezetimibe, Repatha, Praluent, Nexletol, Nexlizet.  The cards are usually good for $2,500 or 12 months, whichever comes first. Go to healthwellfoundation.org Click on "Apply Now" Answer questions as to whom is applying (patient or representative) Your disease fund will be "hypercholesterolemia - Medicare access" They will ask questions about finances and which medications you are taking for cholesterol When you submit, the approval is usually within minutes.  You will need to print the card information from the site You will need to show this information to your pharmacy, they will bill your Medicare Part D plan first -then bill [employer] --for the copay.   You can also call them at [phone], although the hold times can be quite long.     *If you need a refill on your cardiac medications before your next appointment, please call your pharmacy*   Lab Work: FASTING lab work to check cholesterol in 3-4 months -- complete 1 week before your next visit with Dr. [phone]   If you have labs (blood work) drawn today and your tests are completely normal, you will receive your results only by: MyChart Message (if you have MyChart) OR A paper copy in the mail If you have any lab test that is abnormal or we need to change your treatment, we will call you to review the results.   Testing/Procedures: NONE   Follow-Up: At [facility], you and your health needs are our priority.  As part of our continuing mission to provide you with exceptional heart care, we have created designated Provider Care Teams.  These Care Teams include your primary Cardiologist (physician) and Advanced Practice Providers (APPs -  Physician Assistants and Nurse  Practitioners) who all work together to provide you with the care you need, when you need it.  We recommend signing up for the patient portal called "MyChart".  Sign up information is provided on this After Visit Summary.  MyChart is used to connect with patients for Virtual Visits (Telemedicine).  Patients are able to view lab/test results, encounter notes, upcoming appointments, etc.  Non-urgent messages can be sent to your provider as well.   To learn more about what you can do with MyChart, go to [facility].    Your next appointment:   3-4 month(s)  The format for your next appointment:   In Person  Provider:   [email or web address] - lipid clinic

## 2021-09-28 NOTE — Progress Notes (Signed)
LIPID CLINIC CONSULT NOTE  Chief Complaint:  Manage dyslipidemia  Primary Care Physician: Andi Devon, MD  Primary Cardiologist:  None  HPI:  [name] is a 80 y.o. female who is being seen today for the evaluation of dyslipidemia at the request of Runell Gess, MD. this is a pleasant 80 year old female with a history of coronary artery disease status post PCI and stenting of her LAD and RCA in 2006.  Fortunately she has had no recurrent coronary disease.  She had a Myoview stress test performed in 2018 which was negative.  She unfortunately cannot tolerate statins.  She is tried multiple statins including high and low intensity statins in the past which caused her significant myalgias.  She was then transitioned to ezetimibe.  Her wrist most recent lipids show total cholesterol 208, triglycerides 143, HDL 58 and LDL 125.  Her target LDL is less than 70 as she would not be considered "very high risk".  She recently started taking Colestid off.  She is trying to make some dietary changes as well.  PMHx:  Past Medical History:  Diagnosis Date   Coronary artery disease    H/O: hysterectomy    History of palpitations    History of vertigo    Hypercholesteremia    Hyperlipidemia    Hypertension    Normocytic anemia    Syncope    Thought to be related to dehydration in the setting of ongoing antihypertensive use    Past Surgical History:  Procedure Laterality Date   APPENDECTOMY     CHOLECYSTECTOMY     CORONARY ANGIOPLASTY WITH STENT PLACEMENT      FAMHx:  Family History  Problem Relation Age of Onset   Liver cancer Mother     SOCHx:   reports that she has never smoked. She has never used smokeless tobacco. She reports that she does not drink alcohol and does not use drugs.  ALLERGIES:  Allergies  Allergen Reactions   Statins Other (See Comments)   Codeine Nausea And Vomiting    ROS: Pertinent items noted in HPI and remainder of comprehensive  ROS otherwise negative.  HOME MEDS: Current Outpatient Medications on File Prior to Visit  Medication Sig Dispense Refill   amLODipine (NORVASC) 5 MG tablet amlodipine 5 mg tablet  TAKE 1 TABLET BY MOUTH EVERY DAY     b complex vitamins capsule Take 1 capsule by mouth daily.     Cholecalciferol 1000 UNITS tablet Take 1,000 Units by mouth daily.     Coenzyme Q10 (CO Q 10) 10 MG CAPS Take by mouth.     ezetimibe (ZETIA) 10 MG tablet Take 10 mg by mouth daily.     Plant Sterol Stanol-Pantethine (CHOLEST OFF COMPLETE PO) Take by mouth.     valsartan (DIOVAN) 160 MG tablet valsartan 160 mg tablet  TAKE 1 TABLET BY MOUTH EVERY DAY     cloNIDine (CATAPRES) 0.2 MG tablet Take 0.2 mg by mouth daily. (Patient not taking: Reported on 09/28/2021)     meloxicam (MOBIC) 15 MG tablet meloxicam 15 mg tablet  Take 1 tab po daily (Patient not taking: Reported on 09/28/2021)     No current facility-administered medications on file prior to visit.    LABS/IMAGING: No results found for this or any previous visit (from the past 48 hour(s)). No results found.  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   Mrs. Kassa has a mixed dyslipidemia and is statin intolerant causing myalgias.  Her target LDL is less than 70.  She has been maintained on ezetimibe but is above her target.  We  discussed options including PCSK9 inhibitors as well as bempedoic acid.  She is not really interested in any injectable therapies.  She would like to pursue bempedoic acid, therefore we will reach out for prior authorization.  Given her coronary history and targets as well as intolerance to statins, I think this is a good option for her.  Plan repeat lipids in about 3 to 4 months and follow-up with me at that time.  Thanks as always for the kind referral.  Ilsa Iha, MD, Chrystie Nose  [place]  [facility]  Medical Director of the Advanced Lipid Disorders &  Cardiovascular Risk Reduction Clinic Diplomate of the American Board of Clinical Lipidology Attending Cardiologist  Direct Dial: [phone]  Fax: [phone]  Website:  [email or web address] [name] 09/28/2021, 8:25 AM

## 2021-10-06 ENCOUNTER — Telehealth: Payer: Self-pay | Admitting: Internal Medicine

## 2021-10-06 NOTE — Telephone Encounter (Signed)
Patient notified med is approved.  

## 2021-10-06 NOTE — Telephone Encounter (Signed)
Spoke with patient about prior medications tried/failed She reports having taking pravastatin, simvastatin the past w/intolerances  Submitted PA for nexletol 180mg  QD with this information (Key: [id number])

## 2021-10-06 NOTE — Telephone Encounter (Signed)
Approved today Request Reference Number: [id number]. NEXLETOL TAB 180MG  is approved through 11/06/2022. Your patient may now fill this prescription and it will be covered.

## 2022-01-21 LAB — LIPID PANEL
Chol/HDL Ratio: 3 ratio (ref 0.0–4.4)
Cholesterol, Total: 155 mg/dL (ref 100–199)
HDL: 52 mg/dL (ref >39)
LDL Chol Calc (NIH): 82 mg/dL (ref 0–99)
Triglycerides: 117 mg/dL (ref 0–149)
VLDL Cholesterol Cal: 21 mg/dL (ref 5–40)

## 2022-01-28 ENCOUNTER — Ambulatory Visit: Payer: Medicare Other | Admitting: Internal Medicine

## 2022-01-28 ENCOUNTER — Other Ambulatory Visit: Payer: Self-pay

## 2022-01-28 ENCOUNTER — Encounter: Payer: Self-pay | Admitting: Internal Medicine

## 2022-01-28 VITALS — BP 154/74 | HR 70 | Ht 66.0 in | Wt 127.0 lb

## 2022-01-28 DIAGNOSIS — T466X5D Adverse effect of antihyperlipidemic and antiarteriosclerotic drugs, subsequent encounter: Secondary | ICD-10-CM

## 2022-01-28 DIAGNOSIS — M791 Myalgia, unspecified site: Secondary | ICD-10-CM

## 2022-01-28 DIAGNOSIS — I10 Essential (primary) hypertension: Secondary | ICD-10-CM | POA: Diagnosis not present

## 2022-01-28 DIAGNOSIS — E785 Hyperlipidemia, unspecified: Secondary | ICD-10-CM | POA: Diagnosis not present

## 2022-01-28 DIAGNOSIS — T466X5A Adverse effect of antihyperlipidemic and antiarteriosclerotic drugs, initial encounter: Secondary | ICD-10-CM

## 2022-01-28 DIAGNOSIS — I2510 Atherosclerotic heart disease of native coronary artery without angina pectoris: Secondary | ICD-10-CM | POA: Diagnosis not present

## 2022-01-28 DIAGNOSIS — I739 Peripheral vascular disease, unspecified: Secondary | ICD-10-CM

## 2022-01-28 NOTE — Progress Notes (Signed)
? ? ?LIPID CLINIC CONSULT NOTE ? ?Chief Complaint:  ?Follow-up dyslipidemia ? ?Primary Care Physician: ?Andi Devon, MD ? ?Primary Cardiologist:  ?None ? ?HPI:  ?[name] is a 81 y.o. female who is being seen today for the evaluation of dyslipidemia at the request of Andi Devon, MD. this is a pleasant 81 year old female with a history of coronary artery disease status post PCI and stenting of her LAD and RCA in 2006.  Fortunately she has had no recurrent coronary disease.  She had a Myoview stress test performed in 2018 which was negative.  She unfortunately cannot tolerate statins.  She is tried multiple statins including high and low intensity statins in the past which caused her significant myalgias.  She was then transitioned to ezetimibe.  Her wrist most recent lipids show total cholesterol 208, triglycerides 143, HDL 58 and LDL 125.  Her target LDL is less than 70 as she would not be considered "very high risk".  She recently started taking Colestid off.  She is trying to make some dietary changes as well. ? ?01/28/2022 ? ?[name] returns today for follow-up of dyslipidemia.  She is tolerating Nexletol in addition to ezetimibe.  We discussed combining the 2 medicines but she is okay with taking 2 separate pills.  She has had interval reduction in her lipids.  Total cholesterol now 155, down from 208.  Triglycerides 117 (down from 143), and LDL cholesterol 82 down from 125.  She remains above a target LDL less than 70 and we discussed additional options today.  She still feels that she is unwilling to trial statin therapy.  We discussed the option of trying over-the-counter red yeast rice.  Although this is not guideline based, it may be better than no additional therapy.  It is likely that she could get about 5 to 10% additional reduction in LDL cholesterol and probably will be well-tolerated. ? ?PMHx:  ?Past Medical History:  ?Diagnosis Date  ? Coronary artery disease   ? H/O:  hysterectomy   ? History of palpitations   ? History of vertigo   ? Hypercholesteremia   ? Hyperlipidemia   ? Hypertension   ? Normocytic anemia   ? Syncope   ? Thought to be related to dehydration in the setting of ongoing antihypertensive use  ? ? ?Past Surgical History:  ?Procedure Laterality Date  ? APPENDECTOMY    ? CHOLECYSTECTOMY    ? CORONARY ANGIOPLASTY WITH STENT PLACEMENT    ? ? ?FAMHx:  ?Family History  ?Problem Relation Age of Onset  ? Liver cancer Mother   ? ? ?SOCHx:  ? reports that she has never smoked. She has never used smokeless tobacco. She reports that she does not drink alcohol and does not use drugs. ? ?ALLERGIES:  ?Allergies  ?Allergen Reactions  ? Statins Other (See Comments)  ? Codeine Nausea And Vomiting  ? ? ?ROS: ?Pertinent items noted in HPI and remainder of comprehensive ROS otherwise negative. ? ?HOME MEDS: ?Current Outpatient Medications on File Prior to Visit  ?Medication Sig Dispense Refill  ? amLODipine (NORVASC) 5 MG tablet amlodipine 5 mg tablet ? TAKE 1 TABLET BY MOUTH EVERY DAY    ? b complex vitamins capsule Take 1 capsule by mouth daily.    ? Bempedoic Acid (NEXLETOL) 180 MG TABS Take 1 tablet by mouth daily. 30 tablet 11  ? Cholecalciferol 1000 UNITS tablet Take 1,000 Units by mouth daily.    ? cloNIDine (CATAPRES) 0.2 MG tablet Take 0.2  mg by mouth daily. (Patient not taking: Reported on 09/28/2021)    ? Coenzyme Q10 (CO Q 10) 10 MG CAPS Take by mouth.    ? ezetimibe (ZETIA) 10 MG tablet Take 10 mg by mouth daily.    ? meloxicam (MOBIC) 15 MG tablet meloxicam 15 mg tablet ? Take 1 tab po daily (Patient not taking: Reported on 09/28/2021)    ? Plant Sterol Stanol-Pantethine (CHOLEST OFF COMPLETE PO) Take by mouth.    ? valsartan (DIOVAN) 160 MG tablet valsartan 160 mg tablet ? TAKE 1 TABLET BY MOUTH EVERY DAY    ? ?No current facility-administered medications on file prior to visit.  ? ? ?LABS/IMAGING: ?No results found for this or any previous visit (from the past 48  hour(s)). ?No results found. ? ?LIPID PANEL: ?   ?Component Value Date/Time  ? CHOL 155 01/21/2022 0811  ? TRIG 117 01/21/2022 0811  ? HDL 52 01/21/2022 0811  ? CHOLHDL 3.0 01/21/2022 0811  ? LDLCALC 82 01/21/2022 0811  ? ? ?WEIGHTS: ?Wt Readings from Last 3 Encounters:  ?09/28/21 127 lb 6.4 oz (57.8 kg)  ?07/06/21 127 lb 3.2 oz (57.7 kg)  ?06/23/20 127 lb 12.8 oz (58 kg)  ? ? ?VITALS: ?There were no vitals taken for this visit. ? ?EXAM: ?Deferred ? ?EKG: ?Deferred ? ?ASSESSMENT: ?Mixed dyslipidemia, goal to less than 70 ?Coronary artery disease status post PCI to the LAD and RCA ?Essential hypertension ?Statin myalgias ? ?PLAN: ?1.   [name] has had further improvement with Nexletol in addition to ezetimibe.  She remains above a target LDL less than 70.  I do not think she will reach that target with diet and lifestyle modifications.  She is unwilling to trial any synthetic statins but did seem amenable to trying red yeast rice.  She ready is on Colestoff as well.  I advised her to try the red yeast rice and we will plan to repeat lipid in about 6 months and follow-up at that time. ? ?Chrystie Nose, MD, [facility], FACP  ?[place]  [facility]  ?Medical Director of the Advanced Lipid Disorders &  ?Cardiovascular Risk Reduction Clinic ?Diplomate of the [employer] of Clinical Lipidology ?Attending Cardiologist  ?Direct Dial: [phone]  Fax: [phone]  ?Website:  [email or web address] ? ?[name] ?01/28/2022, 8:21 AM ? ?

## 2022-01-28 NOTE — Patient Instructions (Signed)
Medication Instructions:  ?Dr. Rennis Golden recommends to start RED YEAST RICE over-the-counter (as directed) ? ?*If you need a refill on your cardiac medications before your next appointment, please call your pharmacy* ? ? ?Lab Work: ?FASTING lipid panel in about 6 months (about 1 week before next visit) ? ?If you have labs (blood work) drawn today and your tests are completely normal, you will receive your results only by: ?MyChart Message (if you have MyChart) OR ?A paper copy in the mail ?If you have any lab test that is abnormal or we need to change your treatment, we will call you to review the results. ? ? ?Testing/Procedures: ?NONE ? ? ?Follow-Up: ?At [facility], you and your health needs are our priority.  As part of our continuing mission to provide you with exceptional heart care, we have created designated Provider Care Teams.  These Care Teams include your primary Cardiologist (physician) and Advanced Practice Providers (APPs -  Physician Assistants and Nurse Practitioners) who all work together to provide you with the care you need, when you need it. ? ?We recommend signing up for the patient portal called "MyChart".  Sign up information is provided on this After Visit Summary.  MyChart is used to connect with patients for Virtual Visits (Telemedicine).  Patients are able to view lab/test results, encounter notes, upcoming appointments, etc.  Non-urgent messages can be sent to your provider as well.   ?To learn more about what you can do with MyChart, go to [email or web address].   ? ?Your next appointment:   ?6 month(s) ? ?The format for your next appointment:   ?In Person ? ?Provider:   ?Dr. Zoila Shutter - lipid clinic ? ?

## 2022-03-30 ENCOUNTER — Other Ambulatory Visit: Payer: Self-pay | Admitting: Internal Medicine

## 2022-03-30 DIAGNOSIS — Z1231 Encounter for screening mammogram for malignant neoplasm of breast: Secondary | ICD-10-CM

## 2022-04-25 ENCOUNTER — Ambulatory Visit: Payer: Medicare Other

## 2022-05-05 ENCOUNTER — Ambulatory Visit
Admission: RE | Admit: 2022-05-05 | Discharge: 2022-05-05 | Disposition: A | Discharge status: Home / Self Care | Payer: Medicare Other | Source: Ambulatory Visit | Attending: Internal Medicine | Admitting: Internal Medicine

## 2022-05-05 DIAGNOSIS — Z1231 Encounter for screening mammogram for malignant neoplasm of breast: Secondary | ICD-10-CM

## 2022-08-26 ENCOUNTER — Ambulatory Visit: Payer: Medicare Other | Attending: Cardiovascular Disease | Admitting: Cardiovascular Disease

## 2022-08-26 ENCOUNTER — Encounter: Payer: Self-pay | Admitting: Cardiovascular Disease

## 2022-08-26 VITALS — BP 130/60 | HR 74 | Ht 66.0 in | Wt 130.2 lb

## 2022-08-26 DIAGNOSIS — I2510 Atherosclerotic heart disease of native coronary artery without angina pectoris: Secondary | ICD-10-CM | POA: Diagnosis not present

## 2022-08-26 DIAGNOSIS — E785 Hyperlipidemia, unspecified: Secondary | ICD-10-CM | POA: Diagnosis not present

## 2022-08-26 DIAGNOSIS — E782 Mixed hyperlipidemia: Secondary | ICD-10-CM | POA: Diagnosis not present

## 2022-08-26 DIAGNOSIS — I10 Essential (primary) hypertension: Secondary | ICD-10-CM | POA: Diagnosis not present

## 2022-08-26 NOTE — Assessment & Plan Note (Signed)
History of essential hypertension a blood pressure measured today at 130/60 she is on amlodipine and valsartan.

## 2022-08-26 NOTE — Progress Notes (Signed)
08/26/2022 [name]   [birth year]  [id number]  Primary Physician Andi Devon, MD Primary Cardiologist: Runell Gess MD Milagros Loll, [place], [place]  HPI:  [name] is a 81 y.o.   hin-appearing, married Caucasian female, mother of 3, grandmother to 3 grandchildren and great-grandmother to 1 great-grandchild whose husband [name] is also a patient of mine. I saw her In the office 07/06/2021.  She has a history of CAD status Myers staged PCI and stenting of her LAD and RCA with drug-eluting stents back in 2006. Her other problems include hypertension and hyperlipidemia. Dr. Renae Gloss follows her lipid profile. She is statin intolerant.  When I saw her 3 years ago she was complaining of some atypical left jaw and neck pain as well as some progressive dyspnea.  A 2D echo performed 05/21/2018 was essentially normal with grade 2 diastolic dysfunction and a Myoview stress test performed 05/16/2017 was nonischemic.    Since I saw her in the office a year ago she continues to do well.  She denies chest pain or shortness of breath.   Current Meds  Medication Sig   amLODipine (NORVASC) 5 MG tablet amlodipine 5 mg tablet  TAKE 1 TABLET BY MOUTH EVERY DAY   b complex vitamins capsule Take 1 capsule by mouth daily.   Bempedoic Acid (NEXLETOL) 180 MG TABS Take 1 tablet by mouth daily.   Cholecalciferol 1000 UNITS tablet Take 1,000 Units by mouth daily.   Coenzyme Q10 (CO Q 10) 10 MG CAPS Take by mouth.   ezetimibe (ZETIA) 10 MG tablet Take 10 mg by mouth daily.   valsartan (DIOVAN) 160 MG tablet valsartan 160 mg tablet  TAKE 1 TABLET BY MOUTH EVERY DAY     Allergies  Allergen Reactions   Statins Other (See Comments)   Codeine Nausea And Vomiting    Social History   Socioeconomic History   Marital status: Married    Spouse name: Not on file   Number of children: Not on file   Years of education: Not on file   Highest education level: Not on file  Occupational History    Not on file  Tobacco Use   Smoking status: Never   Smokeless tobacco: Never  Substance and Sexual Activity   Alcohol use: No   Drug use: No   Sexual activity: Not on file  Other Topics Concern   Not on file  Social History Narrative   Not on file   Social Determinants of Health   Financial Resource Strain: Not on file  Food Insecurity: Not on file  Transportation Needs: Not on file  Physical Activity: Not on file  Stress: Not on file  Social Connections: Not on file  Intimate Partner Violence: Not on file     Review of Systems: General: negative for chills, fever, night sweats or weight changes.  Cardiovascular: negative for chest pain, dyspnea on exertion, edema, orthopnea, palpitations, paroxysmal nocturnal dyspnea or shortness of breath Dermatological: negative for rash Respiratory: negative for cough or wheezing Urologic: negative for hematuria Abdominal: negative for nausea, vomiting, diarrhea, bright red blood per rectum, melena, or hematemesis Neurologic: negative for visual changes, syncope, or dizziness All other systems reviewed and are otherwise negative except as noted above.    Blood pressure 130/60, pulse 74, height 5\' 6"  (1.676 m), weight 130 lb 3.2 oz (59.1 kg), SpO2 96 %.  General appearance: alert and no distress Neck: no adenopathy, no carotid bruit, no JVD, supple, symmetrical, trachea  midline, and thyroid not enlarged, symmetric, no tenderness/mass/nodules Lungs: clear to auscultation bilaterally Heart: regular rate and rhythm, S1, S2 normal, no murmur, click, rub or gallop Extremities: extremities normal, atraumatic, no cyanosis or edema Pulses: 2+ and symmetric Skin: Skin color, texture, turgor normal. No rashes or lesions Neurologic: Grossly normal  EKG sinus rhythm at 74 with occasional PVCs, and sinus arrhythmia.  I personally reviewed this EKG.  ASSESSMENT AND PLAN:   Coronary artery disease History of CAD status Myers LAD and RCA stenting  by myself back in 2006 with drug-eluting stents.  Her last stress test performed 05/16/2017 was nonischemic.  She is asymptomatic.  Essential hypertension History of essential hypertension a blood pressure measured today at 130/60 she is on amlodipine and valsartan.  Hyperlipidemia History of hyperlipidemia on Nexletol and Zetia followed by Dr. Debara Pickett.  Her last lipid profile performed 01/21/2022 revealed total cholesterol 155, LDL of 82 and HDL 52.     Lorretta Harp MD [facility], [facility] 08/26/2022 9:34 AM

## 2022-08-26 NOTE — Assessment & Plan Note (Signed)
History of CAD status post LAD and RCA stenting by myself back in 2006 with drug-eluting stents.  Her last stress test performed 05/16/2017 was nonischemic.  She is asymptomatic.

## 2022-08-26 NOTE — Assessment & Plan Note (Signed)
History of hyperlipidemia on Nexletol and Zetia followed by Dr. Debara Pickett.  Her last lipid profile performed 01/21/2022 revealed total cholesterol 155, LDL of 82 and HDL 52.

## 2022-08-26 NOTE — Patient Instructions (Signed)
Medication Instructions:  Your physician recommends that you continue on your current medications as directed. Please refer to the Current Medication list given to you today.  *If you need a refill on your cardiac medications before your next appointment, please call your pharmacy*   Follow-Up: At  [facility], you and your health needs are our priority.  As part of our continuing mission to provide you with exceptional heart care, we have created designated Provider Care Teams.  These Care Teams include your primary Cardiologist (physician) and Advanced Practice Providers (APPs -  Physician Assistants and Nurse Practitioners) who all work together to provide you with the care you need, when you need it.  We recommend signing up for the patient portal called "MyChart".  Sign up information is provided on this After Visit Summary.  MyChart is used to connect with patients for Virtual Visits (Telemedicine).  Patients are able to view lab/test results, encounter notes, upcoming appointments, etc.  Non-urgent messages can be sent to your provider as well.   To learn more about what you can do with MyChart, go to [email or web address].    Your next appointment:   12 month(s)  The format for your next appointment:   In Person  Provider:   Jonathan Berry, MD   

## 2022-10-23 ENCOUNTER — Other Ambulatory Visit: Payer: Self-pay | Admitting: Internal Medicine

## 2022-11-09 ENCOUNTER — Telehealth: Payer: Self-pay | Admitting: Internal Medicine

## 2022-11-09 NOTE — Telephone Encounter (Signed)
Received request to renew Nexletol auth in [facility] Message received:  Key: [id number] - [place] Case ID: [id number]  This medication or product was previously approved on [id number] from 2022-11-07 to 2023-11-07.

## 2023-05-19 ENCOUNTER — Other Ambulatory Visit: Payer: Self-pay | Admitting: Internal Medicine

## 2023-05-19 DIAGNOSIS — Z1231 Encounter for screening mammogram for malignant neoplasm of breast: Secondary | ICD-10-CM

## 2023-05-25 ENCOUNTER — Ambulatory Visit
Admission: RE | Admit: 2023-05-25 | Discharge: 2023-05-25 | Disposition: A | Discharge status: Home / Self Care | Payer: Medicare Other | Source: Ambulatory Visit | Attending: Internal Medicine | Admitting: Internal Medicine

## 2023-05-25 DIAGNOSIS — Z1231 Encounter for screening mammogram for malignant neoplasm of breast: Secondary | ICD-10-CM

## 2023-09-20 ENCOUNTER — Encounter: Payer: Self-pay | Admitting: Cardiovascular Disease

## 2023-09-20 ENCOUNTER — Ambulatory Visit: Payer: Medicare Other | Attending: Cardiovascular Disease | Admitting: Cardiovascular Disease

## 2023-09-20 VITALS — BP 138/64 | HR 67 | Ht 66.0 in | Wt 129.0 lb

## 2023-09-20 DIAGNOSIS — E782 Mixed hyperlipidemia: Secondary | ICD-10-CM | POA: Diagnosis not present

## 2023-09-20 DIAGNOSIS — I2510 Atherosclerotic heart disease of native coronary artery without angina pectoris: Secondary | ICD-10-CM

## 2023-09-20 DIAGNOSIS — I10 Essential (primary) hypertension: Secondary | ICD-10-CM | POA: Diagnosis not present

## 2023-09-20 NOTE — Assessment & Plan Note (Addendum)
History of hyperlipidemia intolerant to statin therapy on Zetia and Nexletol followed by Dr. Rennis Golden.  Her last lipid profile performed 01/26/2023 revealed total cholesterol 181, LDL 102 and HDL of 42.  Her particle number was [id number].  She is not at goal for secondary prevention.  I am going to have her see Dr. Rennis Golden back to discuss initiation of a PCSK9.

## 2023-09-20 NOTE — Assessment & Plan Note (Signed)
History of CAD status post LAD and RCA intervention by myself using drug-eluting stents in 2006.  She did have a Myoview stress test performed 05/16/2017 which was nonischemic.  She denies chest pain or shortness of.

## 2023-09-20 NOTE — Progress Notes (Signed)
09/20/2023 [name]   09-24-1941  [id number]  Primary Physician Casey Devon, MD Primary Cardiologist: Casey Gess MD [name], [name], [place]  HPI:  [name] is a 82 y.o.    thin-appearing, married Caucasian female, mother of 3, grandmother to 3 grandchildren and great-grandmother to 4 great-grandchild whose husband [name] is also a patient of mine.  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  I saw her In the office 08/26/2022.  She has a history of CAD status Myers staged PCI and stenting of her LAD and RCA with drug-eluting stents back in 2006. Her other problems include hypertension and hyperlipidemia. Dr. Renae Myers follows her lipid profile. She is statin intolerant.  When I saw her 3 years ago she was complaining of some atypical left jaw and neck pain as well as some progressive dyspnea.  A 2D echo performed 05/21/2018 was essentially normal with grade 2 diastolic dysfunction and a Myoview stress test performed 05/16/2017 was nonischemic.    Since I saw her in the office a year ago she continues to do well.  She denies chest pain or shortness of breath.     Current Meds  Medication Sig   amLODipine (NORVASC) 5 MG tablet amlodipine 5 mg tablet  TAKE 1 TABLET BY MOUTH EVERY DAY   b complex vitamins capsule Take 1 capsule by mouth daily.   Bempedoic Acid (NEXLETOL) 180 MG TABS TAKE 1 TABLET BY MOUTH DAILY   Cholecalciferol 1000 UNITS tablet Take 1,000 Units by mouth daily.   Coenzyme Q10 (CO Q 10) 10 MG CAPS Take by mouth.   ezetimibe (ZETIA) 10 MG tablet Take 10 mg by mouth daily.   valsartan (DIOVAN) 160 MG tablet valsartan 160 mg tablet  TAKE 1 TABLET BY MOUTH EVERY DAY     Allergies  Allergen Reactions   Statins Other (See Comments)   Codeine Nausea And Vomiting    Social History   Socioeconomic History   Marital status: Married    Spouse name: Not on file   Number of  children: Not on file   Years of education: Not on file   Highest education level: Not on file  Occupational History   Not on file  Tobacco Use   Smoking status: Never   Smokeless tobacco: Never  Substance and Sexual Activity   Alcohol use: No   Drug use: No   Sexual activity: Not on file  Other Topics Concern   Not on file  Social History Narrative   Not on file   Social Determinants of Health   Financial Resource Strain: Not on file  Food Insecurity: Not on file  Transportation Needs: Not on file  Physical Activity: Not on file  Stress: Not on file  Social Connections: Not on file  Intimate Partner Violence: Not on file     Review of Systems: General: negative for chills, fever, night sweats or weight changes.  Cardiovascular: negative for chest pain, dyspnea on exertion, edema, orthopnea, palpitations, paroxysmal nocturnal dyspnea or shortness of breath Dermatological: negative for rash Respiratory: negative for cough or wheezing Urologic: negative for hematuria Abdominal: negative for nausea, vomiting, diarrhea, bright red blood per rectum, melena, or hematemesis Neurologic: negative for visual changes, syncope, or dizziness All other systems reviewed and are otherwise negative except as noted above.    Blood pressure 138/64, pulse 67, height 5\' 6"  (1.676 m), weight  129 lb (58.5 kg), SpO2 91%.  General appearance: alert and no distress Neck: no adenopathy, no carotid bruit, no JVD, supple, symmetrical, trachea midline, and thyroid not enlarged, symmetric, no tenderness/mass/nodules Lungs: clear to auscultation bilaterally Heart: regular rate and rhythm, S1, S2 normal, no murmur, click, rub or gallop Extremities: extremities normal, atraumatic, no cyanosis or edema Pulses: 2+ and symmetric Skin: Skin color, texture, turgor normal. No rashes or lesions Neurologic: Grossly normal  EKG EKG Interpretation Date/Time:  Wednesday September 20 2023 09:14:07  EST Ventricular Rate:  67 PR Interval:  128 QRS Duration:  80 QT Interval:  392 QTC Calculation: 414 R Axis:   56  Text Interpretation: Sinus rhythm with marked sinus arrhythmia with occasional Premature ventricular complexes Nonspecific ST and T wave abnormality When compared with ECG of 23-Jul-2016 12:51, PREVIOUS ECG IS PRESENT Confirmed by [name] [phone]) on 09/20/2023 9:15:55 AM    ASSESSMENT AND PLAN:   Coronary artery disease History of CAD status Myers LAD and RCA intervention by myself using drug-eluting stents in 2006.  She did have a Myoview stress test performed 05/16/2017 which was nonischemic.  She denies chest pain or shortness of.  Essential hypertension History of essential hypertension with blood pressure measured today at 138/64.  She is on amlodipine, and Diovan.  Hyperlipidemia History of hyperlipidemia intolerant to statin therapy on Zetia and Nexletol followed by Dr. Rennis Myers.  Her last lipid profile performed 01/26/2023 revealed total cholesterol 181, LDL 102 and HDL of 42.  Her particle number was [id number].  She is not at goal for secondary prevention.  I am going to have her see Dr. Rennis Myers back to discuss initiation of a PCSK9.     Casey Gess MD FACP,FACC,FAHA, [facility] 09/20/2023 9:33 AM

## 2023-09-20 NOTE — Patient Instructions (Addendum)
  Follow-Up: At [facility], you and your health needs are our priority.  As part of our continuing mission to provide you with exceptional heart care, we have created designated Provider Care Teams.  These Care Teams include your primary Cardiologist (physician) and Advanced Practice Providers (APPs -  Physician Assistants and Nurse Practitioners) who all work together to provide you with the care you need, when you need it.  We recommend signing up for the patient portal called "MyChart".  Sign up information is provided on this After Visit Summary.  MyChart is used to connect with patients for Virtual Visits (Telemedicine).  Patients are able to view lab/test results, encounter notes, upcoming appointments, etc.  Non-urgent messages can be sent to your provider as well.   To learn more about what you can do with MyChart, go to [email or web address].    Your next appointment:   On December 19th 2024 at 10:30 AM  Provider:   Dr. Zoila Shutter    12 months with Dr Adria Dill.

## 2023-09-20 NOTE — Assessment & Plan Note (Signed)
History of essential hypertension with blood pressure measured today at 138/64.  She is on amlodipine, and Diovan.

## 2023-10-26 ENCOUNTER — Ambulatory Visit: Payer: Medicare Other | Attending: Internal Medicine | Admitting: Internal Medicine

## 2023-10-26 VITALS — BP 140/60 | HR 78 | Ht 66.0 in | Wt 130.0 lb

## 2023-10-26 DIAGNOSIS — E785 Hyperlipidemia, unspecified: Secondary | ICD-10-CM | POA: Diagnosis not present

## 2023-10-26 DIAGNOSIS — T466X5D Adverse effect of antihyperlipidemic and antiarteriosclerotic drugs, subsequent encounter: Secondary | ICD-10-CM

## 2023-10-26 DIAGNOSIS — T466X5A Adverse effect of antihyperlipidemic and antiarteriosclerotic drugs, initial encounter: Secondary | ICD-10-CM

## 2023-10-26 DIAGNOSIS — I2510 Atherosclerotic heart disease of native coronary artery without angina pectoris: Secondary | ICD-10-CM | POA: Diagnosis not present

## 2023-10-26 DIAGNOSIS — E782 Mixed hyperlipidemia: Secondary | ICD-10-CM

## 2023-10-26 DIAGNOSIS — M791 Myalgia, unspecified site: Secondary | ICD-10-CM

## 2023-10-26 DIAGNOSIS — I739 Peripheral vascular disease, unspecified: Secondary | ICD-10-CM

## 2023-10-26 NOTE — Patient Instructions (Signed)
Medication Instructions:  LEQVIO Dr. Rennis Golden has recommended an injectable medication called LEQVIO. This is administered by a health care provider. The frequency of injections is TWO injections given 3 months apart (loading dose) and then every 6 months after that. The injection appointments at [facility] ([address] [place], [place]  [address]). Once we have the benefits check information, we will reach out to let you know if the medication is covered 100%, if there is a deductible, co-insurance, out-of-pocket max. From there, we will see if you need patient assistance and our team will take care of working on this. Because of the frequency schedule of this medication, your follow up/repeat cholesterol lab work will be about 5-6 months from now.   *If you need a refill on your cardiac medications before your next appointment, please call your pharmacy*   Lab Work: NMR in 6 months If you have labs (blood work) drawn today and your tests are completely normal, you will receive your results only by: MyChart Message (if you have MyChart) OR A paper copy in the mail If you have any lab test that is abnormal or we need to change your treatment, we will call you to review the results.   Testing/Procedures: None    Follow-Up: At [facility], you and your health needs are our priority.  As part of our continuing mission to provide you with exceptional heart care, we have created designated Provider Care Teams.  These Care Teams include your primary Cardiologist (physician) and Advanced Practice Providers (APPs -  Physician Assistants and Nurse Practitioners) who all work together to provide you with the care you need, when you need it.    Your next appointment:   6 month(s)  Provider:   Rennis Golden

## 2023-10-26 NOTE — Progress Notes (Signed)
LIPID CLINIC CONSULT NOTE  Chief Complaint:  Follow-up dyslipidemia  Primary Care Physician: Andi Devon, MD  Primary Cardiologist:  None  HPI:  [name] is a 82 y.o. female who is being seen today for the evaluation of dyslipidemia at the request of Andi Devon, MD. this is a pleasant 82 year old female with a history of coronary artery disease status post PCI and stenting of her LAD and RCA in 2006.  Fortunately she has had no recurrent coronary disease.  She had a Myoview stress test performed in 2018 which was negative.  She unfortunately cannot tolerate statins.  She is tried multiple statins including high and low intensity statins in the past which caused her significant myalgias.  She was then transitioned to ezetimibe.  Her wrist most recent lipids show total cholesterol 208, triglycerides 143, HDL 58 and LDL 125.  Her target LDL is less than 70 as she would not be considered "very high risk".  She recently started taking Colestid off.  She is trying to make some dietary changes as well.  01/28/2022  [name] returns today for follow-up of dyslipidemia.  She is tolerating Nexletol in addition to ezetimibe.  We discussed combining the 2 medicines but she is okay with taking 2 separate pills.  She has had interval reduction in her lipids.  Total cholesterol now 155, down from 208.  Triglycerides 117 (down from 143), and LDL cholesterol 82 down from 125.  She remains above a target LDL less than 70 and we discussed additional options today.  She still feels that she is unwilling to trial statin therapy.  We discussed the option of trying over-the-counter red yeast rice.  Although this is not guideline based, it may be better than no additional therapy.  It is likely that she could get about 5 to 10% additional reduction in LDL cholesterol and probably will be well-tolerated.  10/26/2023  [name] returns today for follow-up.  Recently she saw Dr. Gery Pray who  noted that lipids earlier this year were not well-controlled.  Her LDL particle number was [id number] with an LDL 102 and elevated small LDL particle #741.  She reports compliance with Nexletol and ezetimibe.  She does have some chronic leg aches but not as severe as when she was on the statins.  She is less interested in additional therapy however when reviewing her insurance she may very well qualify for Leqvio as far as good coverage.  This would potentially help her lipids substantially with every 65-month dosing.  PMHx:  Past Medical History:  Diagnosis Date   Coronary artery disease    H/O: hysterectomy    History of palpitations    History of vertigo    Hypercholesteremia    Hyperlipidemia    Hypertension    Normocytic anemia    Syncope    Thought to be related to dehydration in the setting of ongoing antihypertensive use    Past Surgical History:  Procedure Laterality Date   APPENDECTOMY     CHOLECYSTECTOMY     CORONARY ANGIOPLASTY WITH STENT PLACEMENT      FAMHx:  Family History  Problem Relation Age of Onset   Liver cancer Mother     SOCHx:   reports that she has never smoked. She has never used smokeless tobacco. She reports that she does not drink alcohol and does not use drugs.  ALLERGIES:  Allergies  Allergen Reactions   Statins Other (See Comments)   Codeine Nausea And Vomiting  ROS: Pertinent items noted in HPI and remainder of comprehensive ROS otherwise negative.  HOME MEDS: Current Outpatient Medications on File Prior to Visit  Medication Sig Dispense Refill   amLODipine (NORVASC) 5 MG tablet amlodipine 5 mg tablet  TAKE 1 TABLET BY MOUTH EVERY DAY     b complex vitamins capsule Take 1 capsule by mouth daily.     Bempedoic Acid (NEXLETOL) 180 MG TABS TAKE 1 TABLET BY MOUTH DAILY 90 tablet 3   Cholecalciferol 1000 UNITS tablet Take 1,000 Units by mouth daily.     Coenzyme Q10 (CO Q 10) 10 MG CAPS Take by mouth.     ezetimibe (ZETIA) 10 MG tablet Take  10 mg by mouth daily.     valsartan (DIOVAN) 160 MG tablet valsartan 160 mg tablet  TAKE 1 TABLET BY MOUTH EVERY DAY     No current facility-administered medications on file prior to visit.    LABS/IMAGING: No results found for this or any previous visit (from the past 48 hours). No results found.  LIPID PANEL:    Component Value Date/Time   CHOL 155 01/21/2022 0811   TRIG 117 01/21/2022 0811   HDL 52 01/21/2022 0811   CHOLHDL 3.0 01/21/2022 0811   LDLCALC 82 01/21/2022 0811    WEIGHTS: Wt Readings from Last 3 Encounters:  10/26/23 130 lb (59 kg)  09/20/23 129 lb (58.5 kg)  08/26/22 130 lb 3.2 oz (59.1 kg)    VITALS: BP (!) 140/60 (BP Location: Left Arm, Patient Position: Sitting, Cuff Size: Normal)   Pulse 78   Ht 5\' 6"  (1.676 m)   Wt 130 lb (59 kg)   SpO2 97%   BMI 20.98 kg/m   EXAM: Deferred  EKG: Deferred  ASSESSMENT: Mixed dyslipidemia, goal to less than 70 Coronary artery disease status post PCI to the LAD and RCA Essential hypertension Statin myalgias  PLAN: 1.   [name] remains well above target LDL less than 70.  Her LDL particle number is quite elevated.  She may have an elevated LP(a).  She would likely benefit from PCSK9 inhibitor therapy and does have Medicare gap coverage which would potentially cover her cost for Leqvio.  She seemed interested in this therapy.  If she is well treated then it might be possible to stop the ezetimibe as well.  Will do a benefits determination and reach out to her if she qualifies to schedule for therapy.  Plan ultimately repeat lipids in 4 to 6 months and follow-up with me at that time.  Chrystie Nose, MD, [facility], FACP  [place]  [facility]  Medical Director of the Advanced Lipid Disorders &  Cardiovascular Risk Reduction Clinic Diplomate of the American Board of Clinical Lipidology Attending Cardiologist  Direct Dial: [phone]  Fax: [phone]  Website:  [email or web address]  [place] 10/26/2023, 10:31 AM

## 2023-10-27 ENCOUNTER — Other Ambulatory Visit: Payer: Self-pay | Admitting: Internal Medicine

## 2023-10-27 ENCOUNTER — Telehealth: Payer: Self-pay | Admitting: Internal Medicine

## 2023-10-27 NOTE — Telephone Encounter (Signed)
Lorayne Bender has been submitted and I will f/u once I have a response from her insurance.

## 2023-10-27 NOTE — Telephone Encounter (Signed)
Patient seen in office 10/26/23 Suggested to check benefits for [name] Form faxed to service center: [phone]

## 2023-11-02 NOTE — Telephone Encounter (Signed)
Lanell Matar,  Wilber Bihari has been denied under both pharmacy and medical due to patient has not failed or tried preferred medications. 1. Repatha 2. Praluent.  We will d/c the treatment plan.  Thanks Selena Batten

## 2023-11-06 NOTE — Telephone Encounter (Signed)
Chrystie Nose, MD  Amalia Greenhouse, CPhT; Ronnald Nian R, CPhT; Lindell Spar, RN Caller: Unspecified (1 week ago) Would recommend proceeding with Repatha.  Dr. Rennis Golden   Update sent to patient via MyChart

## 2023-11-07 ENCOUNTER — Other Ambulatory Visit: Payer: Self-pay | Admitting: Internal Medicine

## 2023-11-10 ENCOUNTER — Other Ambulatory Visit (HOSPITAL_COMMUNITY): Payer: Self-pay

## 2023-11-10 ENCOUNTER — Telehealth: Payer: Self-pay | Admitting: Pharmacy Technician

## 2023-11-10 NOTE — Telephone Encounter (Signed)
-----   Message from Nurse Andriette E sent at 11/10/2023  9:46 AM EST ----- Regarding: needs PA for Repatha  - thanks Hey team!  This patient needs a PA for repatha  140mg /mL  Per last MD note: Her LDL particle number was [id number] with an LDL 102 and elevated small LDL particle #741.  She reports compliance with Nexletol  and ezetimibe.  She does have some chronic leg aches but not as severe as when she was on the statins.

## 2023-11-10 NOTE — Telephone Encounter (Signed)
 Spoke with patient about leqvio denial. Explained repatha/praluent. She is OK with this. Explained med admin process. Sent her MyChart message w/healthwell grant info.   Message sent to PA team for prior auth

## 2023-11-10 NOTE — Telephone Encounter (Signed)
 Pharmacy Patient Advocate Encounter  Received notification from [employer] that Prior Authorization for repatha  has been APPROVED from 11/10/23 to 05/09/24. Ran test claim, Copay is $64.08 one month. This test claim was processed through [facility]- copay amounts may vary at other pharmacies due to pharmacy/plan contracts, or as the patient moves through the different stages of their insurance plan.   PA #/Case ID/Reference #: [id number]

## 2023-11-10 NOTE — Telephone Encounter (Signed)
 Pharmacy Patient Advocate Encounter   Received notification from Physician's Office that prior authorization for repatha  is required/requested.   Insurance verification completed.   The patient is insured through [facility] .   Per test claim: PA required; PA submitted to above mentioned insurance via CoverMyMeds Key/confirmation #/EOC [id number] Status is pending

## 2023-11-10 NOTE — Telephone Encounter (Signed)
 Update sent to patient in MyChart

## 2023-12-12 MED ORDER — REPATHA SURECLICK 140 MG/ML ~~LOC~~ SOAJ: 140.0000 mg | SUBCUTANEOUS | 11 refills | Status: DC

## 2023-12-12 NOTE — Addendum Note (Signed)
Addended by: Lindell Spar on: 12/12/2023 02:03 PM   Modules accepted: Orders

## 2023-12-12 NOTE — Telephone Encounter (Signed)
 Rx(s) sent to pharmacy electronically.

## 2024-02-21 LAB — LAB REPORT - SCANNED
A1c: 6.2
EGFR: 76

## 2024-04-17 LAB — NMR, LIPOPROFILE
Cholesterol, Total: 96 mg/dL — ABNORMAL LOW (ref 100–199)
HDL Particle Number: 45.5 umol/L (ref >=30.5)
HDL-C: 58 mg/dL (ref >39)
LDL Particle Number: 382 nmol/L (ref <1000)
LDL Size: 19.7 nm — ABNORMAL LOW (ref >20.5)
LDL-C (NIH Calc): 21 mg/dL (ref 0–99)
LP-IR Score: 46 — ABNORMAL HIGH (ref <=45)
Small LDL Particle Number: 234 nmol/L (ref <=527)
Triglycerides: 83 mg/dL (ref 0–149)

## 2024-04-26 ENCOUNTER — Encounter: Payer: Self-pay | Admitting: Internal Medicine

## 2024-04-26 ENCOUNTER — Ambulatory Visit: Payer: Medicare Other | Attending: Internal Medicine | Admitting: Internal Medicine

## 2024-04-26 VITALS — BP 153/78 | HR 81 | Ht 65.0 in | Wt 131.2 lb

## 2024-04-26 DIAGNOSIS — T466X5A Adverse effect of antihyperlipidemic and antiarteriosclerotic drugs, initial encounter: Secondary | ICD-10-CM

## 2024-04-26 DIAGNOSIS — E785 Hyperlipidemia, unspecified: Secondary | ICD-10-CM | POA: Diagnosis not present

## 2024-04-26 DIAGNOSIS — T466X5D Adverse effect of antihyperlipidemic and antiarteriosclerotic drugs, subsequent encounter: Secondary | ICD-10-CM | POA: Diagnosis not present

## 2024-04-26 DIAGNOSIS — I2510 Atherosclerotic heart disease of native coronary artery without angina pectoris: Secondary | ICD-10-CM | POA: Diagnosis not present

## 2024-04-26 DIAGNOSIS — M791 Myalgia, unspecified site: Secondary | ICD-10-CM

## 2024-04-26 NOTE — Progress Notes (Signed)
 LIPID CLINIC CONSULT NOTE  Chief Complaint:  Follow-up dyslipidemia  Primary Care Physician: Yolanda Hence, MD  Primary Cardiologist:  None  HPI:  [name] is a 83 y.o. female who is being seen today for the evaluation of dyslipidemia at the request of Yolanda Hence, MD. this is a pleasant 83 year old female with a history of coronary artery disease status post PCI and stenting of her LAD and RCA in 2006.  Fortunately she has had no recurrent coronary disease.  She had a Myoview  stress test performed in 2018 which was negative.  She unfortunately cannot tolerate statins.  She is tried multiple statins including high and low intensity statins in the past which caused her significant myalgias.  She was then transitioned to ezetimibe.  Her wrist most recent lipids show total cholesterol 208, triglycerides 143, HDL 58 and LDL 125.  Her target LDL is less than 70 as she would not be considered very high risk.  She recently started taking Colestid off.  She is trying to make some dietary changes as well.  01/28/2022  [name] returns today for follow-up of dyslipidemia.  She is tolerating Nexletol  in addition to ezetimibe.  We discussed combining the 2 medicines but she is okay with taking 2 separate pills.  She has had interval reduction in her lipids.  Total cholesterol now 155, down from 208.  Triglycerides 117 (down from 143), and LDL cholesterol 82 down from 125.  She remains above a target LDL less than 70 and we discussed additional options today.  She still feels that she is unwilling to trial statin therapy.  We discussed the option of trying over-the-counter red yeast rice.  Although this is not guideline based, it may be better than no additional therapy.  It is likely that she could get about 5 to 10% additional reduction in LDL cholesterol and probably will be well-tolerated.  10/26/2023  [name] returns today for follow-up.  Recently she saw Dr. Dean Every who  noted that lipids earlier this year were not well-controlled.  Her LDL particle number was 1415 with an LDL 102 and elevated small LDL particle #741.  She reports compliance with Nexletol  and ezetimibe.  She does have some chronic leg aches but not as severe as when she was on the statins.  She is less interested in additional therapy however when reviewing her insurance she may very well qualify for Leqvio as far as good coverage.  This would potentially help her lipids substantially with every 17-month dosing.  04/26/2024  [name] is seen today for follow-up.  She seems to be doing well on the Repatha .  She has had a marked reduction in her lipids.  LDL particle number now 382 with an LDL 21, HDL 58 and triglycerides 83.  A small LDL particle number is 284.  She recently had some testing from her PCP which showed a low APO B of around 30.  Overall she seems to be tolerating the injections.  She did ask today whether she could come off of some of her medications mostly due to cost issues.  She says she pays about $50 a month for the Nexletol  and another $50 for the Repatha .  I encouraged her to consider applying for health well grant although she works part-time and her income may be too much for that.  Overall I think her cholesterol is well-controlled and I would recommend that she stay on all medications at this time.  PMHx:  Past Medical  History:  Diagnosis Date   Coronary artery disease    H/O: hysterectomy    History of palpitations    History of vertigo    Hypercholesteremia    Hyperlipidemia    Hypertension    Normocytic anemia    Syncope    Thought to be related to dehydration in the setting of ongoing antihypertensive use    Past Surgical History:  Procedure Laterality Date   APPENDECTOMY     CHOLECYSTECTOMY     CORONARY ANGIOPLASTY WITH STENT PLACEMENT      FAMHx:  Family History  Problem Relation Age of Onset   Liver cancer Mother     SOCHx:   reports that she has  never smoked. She has never used smokeless tobacco. She reports that she does not drink alcohol and does not use drugs.  ALLERGIES:  Allergies  Allergen Reactions   Statins Other (See Comments)   Codeine Nausea And Vomiting    ROS: Pertinent items noted in HPI and remainder of comprehensive ROS otherwise negative.  HOME MEDS: Current Outpatient Medications on File Prior to Visit  Medication Sig Dispense Refill   amLODipine (NORVASC) 5 MG tablet amlodipine 5 mg tablet  TAKE 1 TABLET BY MOUTH EVERY DAY     b complex vitamins capsule Take 1 capsule by mouth daily.     Cholecalciferol 1000 UNITS tablet Take 1,000 Units by mouth daily.     Coenzyme Q10 (CO Q 10) 10 MG CAPS Take by mouth.     Evolocumab  (REPATHA  SURECLICK) 140 MG/ML SOAJ Inject 140 mg into the skin every 14 (fourteen) days. 2 mL 11   ezetimibe (ZETIA) 10 MG tablet Take 10 mg by mouth daily.     NEXLETOL  180 MG TABS TAKE 1 TABLET BY MOUTH DAILY 90 tablet 3   valsartan (DIOVAN) 160 MG tablet valsartan 160 mg tablet  TAKE 1 TABLET BY MOUTH EVERY DAY     No current facility-administered medications on file prior to visit.    LABS/IMAGING: No results found for this or any previous visit (from the past 48 hours). No results found.  LIPID PANEL:    Component Value Date/Time   CHOL 155 01/21/2022 0811   TRIG 117 01/21/2022 0811   HDL 52 01/21/2022 0811   CHOLHDL 3.0 01/21/2022 0811   LDLCALC 82 01/21/2022 0811    WEIGHTS: Wt Readings from Last 3 Encounters:  04/26/24 131 lb 3.2 oz (59.5 kg)  10/26/23 130 lb (59 kg)  09/20/23 129 lb (58.5 kg)    VITALS: BP (!) 153/78 (BP Location: Left Arm, Patient Position: Sitting)   Pulse 81   Ht 5' 5 (1.651 m)   Wt 131 lb 3.2 oz (59.5 kg)   SpO2 98%   BMI 21.83 kg/m   EXAM: Deferred  EKG: Deferred  ASSESSMENT: Mixed dyslipidemia, goal to less than 70 Coronary artery disease status post PCI to the LAD and RCA Essential hypertension Statin  myalgias  PLAN: 1.   [name] is now achieved target LDL and seems to be doing well on combination therapy.  While we could potentially feasibly back off on her medications to get her to target she seems to be doing well in the current.  I am is to continue with therapies as there is no danger to too low LDL and in fact more studies are showing that lower LDL is better.  Will plan to continue her current therapies.  I encouraged her to apply for the health well grant.  I am happy to see her back on an as-needed basis.  Her cholesterol has been stable and further refills of her Nexletol  and Repatha  could be performed by our pharmacy or pharmacy technicians as ordered by her primary cardiologist.  Thanks for allowing me to participate in her care.  Hazle Lites, MD, [facility], FNLA, FACP  [place]  [facility]  Medical Director of the Advanced Lipid Disorders &  Cardiovascular Risk Reduction Clinic Diplomate of the American Board of Clinical Lipidology Attending Cardiologist  Direct Dial: [phone]  Fax: [phone]  Website:  [email or web address]   Hazle Lites 04/26/2024, 8:19 AM

## 2024-04-26 NOTE — Patient Instructions (Addendum)
 Medication Instructions:  Your physician recommends that you continue on your current medications as directed. Please refer to the Current Medication list given to you today.  *If you need a refill on your cardiac medications before your next appointment, please call your pharmacy*  Follow-Up: At [facility], you and your health needs are our priority.  As part of our continuing mission to provide you with exceptional heart care, our providers are all part of one team.  This team includes your primary Cardiologist (physician) and Advanced Practice Providers or APPs (Physician Assistants and Nurse Practitioners) who all work together to provide you with the care you need, when you need it.  Your next appointment:   AS NEEDED with Dr. Maximo Spar or primary cardiologist Dr. Katheryne Pane  Other instructions:  Patient Assistance:   [facility] offers assistance to help pay for medication copays.  They will cover copays for all cholesterol lowering meds, including statins, fibrates, omega-3 fish oils like Vascepa, ezetimibe, Repatha , Praluent, Nexletol , Nexlizet.  The cards are usually good for $2,500 or 12 months, whichever comes first. Our fax # is [phone] (you will need this to apply) Go to [email or web address].[email or web address] Click on "Apply Now" Answer questions as to whom is applying (patient or representative) Your disease fund will be "hypercholesterolemia - Medicare access" They will ask questions about finances and which medications you are taking for cholesterol When you submit, the approval is usually within minutes.  You will need to print the card information from the site You will need to show this information to your pharmacy, they will bill your Medicare Part D plan first -then bill Health Well --for the copay.   You can also call them at [phone], although the hold times can be quite long.

## 2024-05-03 ENCOUNTER — Other Ambulatory Visit: Payer: Self-pay | Admitting: Internal Medicine

## 2024-05-03 DIAGNOSIS — Z1231 Encounter for screening mammogram for malignant neoplasm of breast: Secondary | ICD-10-CM

## 2024-05-24 ENCOUNTER — Telehealth: Payer: Self-pay | Admitting: Pharmacy Technician

## 2024-05-24 ENCOUNTER — Other Ambulatory Visit (HOSPITAL_COMMUNITY): Payer: Self-pay

## 2024-05-24 NOTE — Telephone Encounter (Signed)
 Pharmacy Patient Advocate Encounter  Received notification from [employer] that Prior Authorization for Repatha  has been APPROVED from 05/24/24 to 11/06/24   PA #/Case ID/Reference #: [id number]  $0.00 copay for 0ne month

## 2024-05-24 NOTE — Telephone Encounter (Signed)
 Pharmacy Patient Advocate Encounter   Received notification from CoverMyMeds that prior authorization for Repatha  is required/requested.   Insurance verification completed.   The patient is insured through [facility] .   Per test claim: PA required; PA submitted to above mentioned insurance via CoverMyMeds Key/confirmation #/EOC [id number] Status is pending

## 2024-06-03 ENCOUNTER — Ambulatory Visit
Admission: RE | Admit: 2024-06-03 | Discharge: 2024-06-03 | Disposition: A | Discharge status: Home / Self Care | Source: Ambulatory Visit | Attending: Internal Medicine | Admitting: Internal Medicine

## 2024-06-03 DIAGNOSIS — Z1231 Encounter for screening mammogram for malignant neoplasm of breast: Secondary | ICD-10-CM

## 2024-09-26 ENCOUNTER — Encounter: Payer: Self-pay | Admitting: Cardiovascular Disease

## 2024-09-26 ENCOUNTER — Ambulatory Visit: Attending: Cardiovascular Disease | Admitting: Cardiovascular Disease

## 2024-09-26 VITALS — BP 132/60 | HR 69 | Ht 65.0 in | Wt 124.0 lb

## 2024-09-26 DIAGNOSIS — E785 Hyperlipidemia, unspecified: Secondary | ICD-10-CM | POA: Diagnosis not present

## 2024-09-26 DIAGNOSIS — I10 Essential (primary) hypertension: Secondary | ICD-10-CM

## 2024-09-26 DIAGNOSIS — E782 Mixed hyperlipidemia: Secondary | ICD-10-CM | POA: Diagnosis not present

## 2024-09-26 DIAGNOSIS — I2510 Atherosclerotic heart disease of native coronary artery without angina pectoris: Secondary | ICD-10-CM

## 2024-09-26 NOTE — Assessment & Plan Note (Signed)
 History of CAD status post LAD and RCA PCI and drug-eluting stenting by myself in 2006.  Her last stress test 05/16/2017 was nonischemic.  She denies chest pain or shortness of breath.

## 2024-09-26 NOTE — Assessment & Plan Note (Signed)
 History of hyperlipidemia intolerant to statin therapy followed by Dr. Mona in the lipid clinic.  She is on Nexletol , Zetia and Repatha  with a lipid profile performed 04/15/2024 revealing total cholesterol 96, LDL 58 and HDL 21.

## 2024-09-26 NOTE — Progress Notes (Signed)
 09/26/2024 Arland Gladis Cleverly   12/16/1940  [id number]  Primary Physician Theo Iha, MD Primary Cardiologist: Dorn JINNY Lesches MD GENI [place], [place], [place]  HPI:  [name] is a 83 y.o.   thin-appearing, married Caucasian female, mother of 3, grandmother to 3 grandchildren and great-grandmother to 4 great-grandchild whose husband Christopher is also a patient of mine.  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  I saw her In the office 08/26/2022.  She has a history of CAD status post staged PCI and stenting of her LAD and RCA with drug-eluting stents back in 2006. Her other problems include hypertension and hyperlipidemia. Dr. Theo follows her lipid profile. She is statin intolerant.  When I saw her 3 years ago she was complaining of some atypical left jaw and neck pain as well as some progressive dyspnea.  A 2D echo performed 05/21/2018 was essentially normal with grade 2 diastolic dysfunction and a Myoview  stress test performed 05/16/2017 was nonischemic.    Since I saw her in the office a year ago she continues to do well.  She denies chest pain or shortness of breath.   Current Meds  Medication Sig   amLODipine (NORVASC) 5 MG tablet amlodipine 5 mg tablet  TAKE 1 TABLET BY MOUTH EVERY DAY   b complex vitamins capsule Take 1 capsule by mouth daily.   Cholecalciferol 1000 UNITS tablet Take 1,000 Units by mouth daily.   Coenzyme Q10 (CO Q 10) 10 MG CAPS Take by mouth.   Evolocumab  (REPATHA  SURECLICK) 140 MG/ML SOAJ Inject 140 mg into the skin every 14 (fourteen) days.   ezetimibe (ZETIA) 10 MG tablet Take 10 mg by mouth daily.   NEXLETOL  180 MG TABS TAKE 1 TABLET BY MOUTH DAILY   valsartan (DIOVAN) 160 MG tablet valsartan 160 mg tablet  TAKE 1 TABLET BY MOUTH EVERY DAY     Allergies  Allergen Reactions   Statins Other (See Comments)   Codeine Nausea And Vomiting     Social History   Socioeconomic History   Marital status: Married    Spouse name: Not on file   Number of children: Not on file   Years of education: Not on file   Highest education level: Not on file  Occupational History   Not on file  Tobacco Use   Smoking status: Never   Smokeless tobacco: Never  Substance and Sexual Activity   Alcohol use: No   Drug use: No   Sexual activity: Not on file  Other Topics Concern   Not on file  Social History Narrative   Not on file   Social Drivers of Health   Financial Resource Strain: Not on file  Food Insecurity: Not on file  Transportation Needs: Not on file  Physical Activity: Not on file  Stress: Not on file  Social Connections: Not on file  Intimate Partner Violence: Not on file     Review of Systems: General: negative for chills, fever, night sweats or weight changes.  Cardiovascular: negative for chest pain, dyspnea on exertion, edema, orthopnea, palpitations, paroxysmal nocturnal dyspnea or shortness of breath Dermatological: negative for rash Respiratory: negative for cough or wheezing Urologic: negative for hematuria Abdominal: negative for nausea, vomiting, diarrhea, bright red blood per rectum, melena, or hematemesis Neurologic: negative for visual changes, syncope, or dizziness All other systems reviewed and are otherwise  negative except as noted above.    Blood pressure 132/60, pulse 69, height 5' 5 (1.651 m), weight 124 lb (56.2 kg), SpO2 98%.  General appearance: alert and no distress Neck: no adenopathy, no carotid bruit, no JVD, supple, symmetrical, trachea midline, and thyroid not enlarged, symmetric, no tenderness/mass/nodules Lungs: clear to auscultation bilaterally Heart: regular rate and rhythm, S1, S2 normal, no murmur, click, rub or gallop Extremities: extremities normal, atraumatic, no cyanosis or edema Pulses: 2+ and symmetric Skin: Skin color, texture, turgor normal. No rashes or  lesions Neurologic: Grossly normal  EKG EKG Interpretation Date/Time:  Thursday September 26 2024 09:06:36 EST Ventricular Rate:  69 PR Interval:  138 QRS Duration:  76 QT Interval:  400 QTC Calculation: 428 R Axis:   63  Text Interpretation: Sinus rhythm with marked sinus arrhythmia When compared with ECG of 20-Sep-2023 09:14, Premature ventricular complexes are no longer Present Confirmed by Court Carrier [phone]) on 09/26/2024 9:08:41 AM    ASSESSMENT AND PLAN:   Coronary artery disease History of CAD status post LAD and RCA PCI and drug-eluting stenting by myself in 2006.  Her last stress test 05/16/2017 was nonischemic.  She denies chest pain or shortness of breath.  Essential hypertension History of essential hypertension blood pressure measured today at 132/60.  She is on amlodipine and valsartan.  Hyperlipidemia History of hyperlipidemia intolerant to statin therapy followed by Dr. Mona in the lipid clinic.  She is on Nexletol , Zetia and Repatha  with a lipid profile performed 04/15/2024 revealing total cholesterol 96, LDL 58 and HDL 21.     Carrier DOROTHA Court MD [employer], [facility] 09/26/2024 9:20 AM

## 2024-09-26 NOTE — Patient Instructions (Signed)

## 2024-09-26 NOTE — Assessment & Plan Note (Signed)
 History of essential hypertension blood pressure measured today at 132/60.  She is on amlodipine and valsartan.

## 2024-10-19 ENCOUNTER — Other Ambulatory Visit: Payer: Self-pay | Admitting: Internal Medicine

## 2024-11-17 ENCOUNTER — Other Ambulatory Visit: Payer: Self-pay | Admitting: Internal Medicine
